# Patient Record
Sex: Female | Born: 1960 | Race: White | Hispanic: No | Marital: Married | Smoking: Former smoker
Health system: Southern US, Community
[De-identification: ages and names within clinical notes are randomized; demographics above are authoritative.]

## PROBLEM LIST (undated history)

## (undated) DIAGNOSIS — F419 Anxiety disorder, unspecified: Secondary | ICD-10-CM

## (undated) DIAGNOSIS — Z8489 Family history of other specified conditions: Secondary | ICD-10-CM

## (undated) DIAGNOSIS — F32A Depression, unspecified: Secondary | ICD-10-CM

## (undated) DIAGNOSIS — T4145XA Adverse effect of unspecified anesthetic, initial encounter: Secondary | ICD-10-CM

## (undated) DIAGNOSIS — Z8669 Personal history of other diseases of the nervous system and sense organs: Secondary | ICD-10-CM

## (undated) DIAGNOSIS — I340 Nonrheumatic mitral (valve) insufficiency: Secondary | ICD-10-CM

## (undated) DIAGNOSIS — T8859XA Other complications of anesthesia, initial encounter: Secondary | ICD-10-CM

## (undated) DIAGNOSIS — H269 Unspecified cataract: Secondary | ICD-10-CM

## (undated) DIAGNOSIS — F329 Major depressive disorder, single episode, unspecified: Secondary | ICD-10-CM

## (undated) DIAGNOSIS — C55 Malignant neoplasm of uterus, part unspecified: Secondary | ICD-10-CM

## (undated) DIAGNOSIS — M069 Rheumatoid arthritis, unspecified: Secondary | ICD-10-CM

## (undated) DIAGNOSIS — I209 Angina pectoris, unspecified: Secondary | ICD-10-CM

## (undated) DIAGNOSIS — M797 Fibromyalgia: Secondary | ICD-10-CM

## (undated) DIAGNOSIS — R011 Cardiac murmur, unspecified: Secondary | ICD-10-CM

## (undated) DIAGNOSIS — I499 Cardiac arrhythmia, unspecified: Secondary | ICD-10-CM

## (undated) DIAGNOSIS — E785 Hyperlipidemia, unspecified: Secondary | ICD-10-CM

## (undated) HISTORY — DX: Fibromyalgia: M79.7

## (undated) HISTORY — PX: APPENDECTOMY: SHX54

## (undated) HISTORY — PX: LAMINECTOMY: SHX219

## (undated) HISTORY — PX: OTHER SURGICAL HISTORY: SHX169

## (undated) HISTORY — PX: CARDIAC CATHETERIZATION: SHX172

## (undated) HISTORY — PX: TONSILLECTOMY: SUR1361

## (undated) HISTORY — PX: EYE SURGERY: SHX253

## (undated) HISTORY — PX: ABDOMINAL HYSTERECTOMY: SHX81

---

## 2013-07-01 ENCOUNTER — Encounter (HOSPITAL_COMMUNITY): Payer: Self-pay | Admitting: Emergency Medicine

## 2013-07-01 ENCOUNTER — Emergency Department (HOSPITAL_COMMUNITY): Payer: BC Managed Care – PPO

## 2013-07-01 ENCOUNTER — Emergency Department (HOSPITAL_COMMUNITY)
Admission: EM | Admit: 2013-07-01 | Discharge: 2013-07-02 | Disposition: A | Discharge status: Home / Self Care | Payer: BC Managed Care – PPO | Attending: Emergency Medicine | Admitting: Emergency Medicine

## 2013-07-01 DIAGNOSIS — Z8542 Personal history of malignant neoplasm of other parts of uterus: Secondary | ICD-10-CM | POA: Insufficient documentation

## 2013-07-01 DIAGNOSIS — Z8679 Personal history of other diseases of the circulatory system: Secondary | ICD-10-CM | POA: Insufficient documentation

## 2013-07-01 DIAGNOSIS — IMO0002 Reserved for concepts with insufficient information to code with codable children: Secondary | ICD-10-CM | POA: Insufficient documentation

## 2013-07-01 DIAGNOSIS — Z79899 Other long term (current) drug therapy: Secondary | ICD-10-CM | POA: Insufficient documentation

## 2013-07-01 DIAGNOSIS — F17200 Nicotine dependence, unspecified, uncomplicated: Secondary | ICD-10-CM | POA: Insufficient documentation

## 2013-07-01 DIAGNOSIS — R079 Chest pain, unspecified: Secondary | ICD-10-CM

## 2013-07-01 DIAGNOSIS — Z7982 Long term (current) use of aspirin: Secondary | ICD-10-CM | POA: Insufficient documentation

## 2013-07-01 DIAGNOSIS — E785 Hyperlipidemia, unspecified: Secondary | ICD-10-CM | POA: Insufficient documentation

## 2013-07-01 DIAGNOSIS — M069 Rheumatoid arthritis, unspecified: Secondary | ICD-10-CM | POA: Insufficient documentation

## 2013-07-01 DIAGNOSIS — R0789 Other chest pain: Secondary | ICD-10-CM | POA: Insufficient documentation

## 2013-07-01 HISTORY — DX: Hyperlipidemia, unspecified: E78.5

## 2013-07-01 HISTORY — DX: Rheumatoid arthritis, unspecified: M06.9

## 2013-07-01 HISTORY — DX: Nonrheumatic mitral (valve) insufficiency: I34.0

## 2013-07-01 HISTORY — DX: Malignant neoplasm of uterus, part unspecified: C55

## 2013-07-01 LAB — BASIC METABOLIC PANEL
BUN: 15 mg/dL (ref 6–23)
CO2: 25 mEq/L (ref 19–32)
Calcium: 10.1 mg/dL (ref 8.4–10.5)
Chloride: 101 mEq/L (ref 96–112)
Creatinine, Ser: 0.93 mg/dL (ref 0.50–1.10)
GFR calc Af Amer: 81 mL/min — ABNORMAL LOW (ref >90)
GFR calc non Af Amer: 70 mL/min — ABNORMAL LOW (ref >90)
Glucose, Bld: 132 mg/dL — ABNORMAL HIGH (ref 70–99)
Potassium: 4.2 mEq/L (ref 3.7–5.3)
Sodium: 140 mEq/L (ref 137–147)

## 2013-07-01 LAB — CBC
HCT: 45.7 % (ref 36.0–46.0)
Hemoglobin: 15.5 g/dL — ABNORMAL HIGH (ref 12.0–15.0)
MCH: 30.6 pg (ref 26.0–34.0)
MCHC: 33.9 g/dL (ref 30.0–36.0)
MCV: 90.1 fL (ref 78.0–100.0)
Platelets: 232 10*3/uL (ref 150–400)
RBC: 5.07 MIL/uL (ref 3.87–5.11)
RDW: 14.8 % (ref 11.5–15.5)
WBC: 11 10*3/uL — ABNORMAL HIGH (ref 4.0–10.5)

## 2013-07-01 LAB — POCT I-STAT TROPONIN I
Troponin i, poc: 0 ng/mL (ref 0.00–0.08)
Troponin i, poc: 0 ng/mL (ref 0.00–0.08)

## 2013-07-01 MED ORDER — NITROGLYCERIN 0.4 MG SL SUBL
0.4000 mg | SUBLINGUAL_TABLET | SUBLINGUAL | Status: DC | PRN
  Filled 2013-07-01: qty 25

## 2013-07-01 NOTE — Discharge Instructions (Signed)
Chest Pain (Nonspecific) °It is often hard to give a specific diagnosis for the cause of chest pain. There is always a chance that your pain could be related to something serious, such as a heart attack or a blood clot in the lungs. You need to follow up with your caregiver for further evaluation. °CAUSES  °· Heartburn. °· Pneumonia or bronchitis. °· Anxiety or stress. °· Inflammation around your heart (pericarditis) or lung (pleuritis or pleurisy). °· A blood clot in the lung. °· A collapsed lung (pneumothorax). It can develop suddenly on its own (spontaneous pneumothorax) or from injury (trauma) to the chest. °· Shingles infection (herpes zoster virus). °The chest wall is composed of bones, muscles, and cartilage. Any of these can be the source of the pain. °· The bones can be bruised by injury. °· The muscles or cartilage can be strained by coughing or overwork. °· The cartilage can be affected by inflammation and become sore (costochondritis). °DIAGNOSIS  °Lab tests or other studies, such as X-rays, electrocardiography, stress testing, or cardiac imaging, may be needed to find the cause of your pain.  °TREATMENT  °· Treatment depends on what may be causing your chest pain. Treatment may include: °· Acid blockers for heartburn. °· Anti-inflammatory medicine. °· Pain medicine for inflammatory conditions. °· Antibiotics if an infection is present. °· You may be advised to change lifestyle habits. This includes stopping smoking and avoiding alcohol, caffeine, and chocolate. °· You may be advised to keep your head raised (elevated) when sleeping. This reduces the chance of acid going backward from your stomach into your esophagus. °· Most of the time, nonspecific chest pain will improve within 2 to 3 days with rest and mild pain medicine. °HOME CARE INSTRUCTIONS  °· If antibiotics were prescribed, take your antibiotics as directed. Finish them even if you start to feel better. °· For the next few days, avoid physical  activities that bring on chest pain. Continue physical activities as directed. °· Do not smoke. °· Avoid drinking alcohol. °· Only take over-the-counter or prescription medicine for pain, discomfort, or fever as directed by your caregiver. °· Follow your caregiver's suggestions for further testing if your chest pain does not go away. °· Keep any follow-up appointments you made. If you do not go to an appointment, you could develop lasting (chronic) problems with pain. If there is any problem keeping an appointment, you must call to reschedule. °SEEK MEDICAL CARE IF:  °· You think you are having problems from the medicine you are taking. Read your medicine instructions carefully. °· Your chest pain does not go away, even after treatment. °· You develop a rash with blisters on your chest. °SEEK IMMEDIATE MEDICAL CARE IF:  °· You have increased chest pain or pain that spreads to your arm, neck, jaw, back, or abdomen. °· You develop shortness of breath, an increasing cough, or you are coughing up blood. °· You have severe back or abdominal pain, feel nauseous, or vomit. °· You develop severe weakness, fainting, or chills. °· You have a fever. °THIS IS AN EMERGENCY. Do not wait to see if the pain will go away. Get medical help at once. Call your local emergency services (911 in [place] Do not drive yourself to the hospital. °MAKE SURE YOU:  °· Understand these instructions. °· Will watch your condition. °· Will get help right away if you are not doing well or get worse. °Document Released: 02/12/2005 Document Revised: 07/28/2011 Document Reviewed: 12/09/2007 °[employer]® Patient Information ©2014 [employer]   [employer]. ° °

## 2013-07-01 NOTE — ED Provider Notes (Signed)
CSN: [id number]     Arrival date & time 07/01/13  1934 History   First MD Initiated Contact with Patient 07/01/13 2009     Chief Complaint  Patient presents with  . Chest Pain     (Consider location/radiation/quality/duration/timing/severity/associated sxs/prior Treatment) HPI  53 year old female with history of mitral valve regurg, hyperlipidemia, and uterine cancer presents complaining of chest pain. Patient reports around 6:30 PM she was laying down when she developed is gradual onset of midsternal chest pressure which radiates to her jaw, and to her back. States the pain lasted for about 40 minutes and resolved. She did take a baby aspirin at that time. Denies any fever, chills, headache, lightheadedness, dizziness, nausea, diaphoresis, shortness of breath, productive cough, hemoptysis, abdominal pain, dysuria, rash, numbness or weakness. States she has pain at this in the past. Usually would have 2-3 episodes per year for more than 5 years, but this episode lasted the longest.  States she has cardiac cath done 2 years ago for the same complaint and was normal. This was done at an outside hospital as patient was living in [place]. She is currently chest pain-free. She denies any recent strenuous exercise. She denies anything that makes the pain better or worse. Patient also mentioned that she has history of rheumatoid arthritis and currently on methotrexate.  She has no significant factors concerning for PE.    Past Medical History  Diagnosis Date  . Hyperlipidemia   . Mitral valve regurgitation   . Rheumatoid arthritis   . Uterine cancer    Past Surgical History  Procedure Laterality Date  . Abdominal hysterectomy     No family history on file. History  Substance Use Topics  . Smoking status: Current Every Day Smoker -- 2.00 packs/day    Types: Cigarettes  . Smokeless tobacco: Never Used  . Alcohol Use: No   OB History   Grav Para Term Preterm Abortions TAB SAB Ect Mult  Living                 Review of Systems  All other systems reviewed and are negative.      Allergies  Bee venom and Erythromycin  Home Medications   Current Outpatient Rx  Name  Route  Sig  Dispense  Refill  . aspirin 81 MG tablet   Oral   Take 81 mg by mouth daily.         Marland Kitchen atorvastatin (LIPITOR) 40 MG tablet   Oral   Take 40 mg by mouth daily.         . clonazePAM (KLONOPIN) 1 MG tablet   Oral   Take 1 mg by mouth 2 (two) times daily.         . folic acid (FOLVITE) 1 MG tablet   Oral   Take 1 mg by mouth daily.         . methotrexate 25 MG/ML injection   Subcutaneous   Inject 15 mg/m2 into the skin once a week. On the same day at bedtime each week on (Sundays)         . predniSONE (DELTASONE) 5 MG tablet   Oral   Take 5 mg by mouth. 4 tabs daily for 7 days, 3 tabs daily for 7 days, 2 tabs daily for 7 days then 1 tab daily         . sertraline (ZOLOFT) 100 MG tablet   Oral   Take 100 mg by mouth at bedtime.         Marland Kitchen  traZODone (DESYREL) 150 MG tablet   Oral   Take 150 mg by mouth at bedtime as needed for sleep.          BP 132/67  Pulse 86  Temp(Src) 98.1 F (36.7 C) (Oral)  Resp 14  Ht 5\' 7"  (1.702 m)  Wt 200 lb (90.719 kg)  BMI 31.32 kg/m2  SpO2 98% Physical Exam  Nursing note and vitals reviewed. Constitutional: She appears well-developed and well-nourished. No distress.  Awake, alert, nontoxic appearance  HENT:  Head: Atraumatic.  Eyes: Conjunctivae are normal. Right eye exhibits no discharge. Left eye exhibits no discharge.  Neck: Neck supple.  Cardiovascular: Normal rate and regular rhythm.   Pulmonary/Chest: Effort normal. No respiratory distress. She exhibits no tenderness (generalized anterior chest wall tenderness to palpation without focal point tenderness, crepitus, emphysema, paradoxical chest movement, or overlying skin changes).  Abdominal: Soft. There is no tenderness. There is no rebound.  Musculoskeletal: She  exhibits no tenderness.  ROM appears intact, no obvious focal weakness  Neurological:  Mental status and motor strength appears intact  Skin: No rash noted.  Psychiatric: She has a normal mood and affect.    ED Course  Procedures (including critical care time)   Date: 07/01/2013  Rate: 92  Rhythm: normal sinus rhythm  QRS Axis: normal  Intervals: normal  ST/T Wave abnormalities: nonspecific ST/T changes  Conduction Disutrbances:multiple PVC  Narrative Interpretation:   Old EKG Reviewed: none available    9:27 PM Patient with episodic midsternal chest pressure. She is currently chest pain-free. She does have some cardiac risk factors including being a smoker, hyperlipidemia.  No lightheadedness or dizziness concerning for aortic dissection.  Chest pain is reproducible, but pt sts she thinks she has fibromyalgias.  She does have remote hx of uterine cancer but of low risk for PE.  Pt is currently well appearing, in NAD.  Work up initiated.    10:00 PM EKG without acute ischemic changes, trop neg, CXR unremarkable, CBC and BMP reassuring.  Care discussed with Dr. Zenia Resides.    11:57 PM Patient has normal delta troponin.  No active CP.  Agrees to f/u with PCP for further care.  Return precaution discussed.    Labs Review Labs Reviewed  CBC - Abnormal; Notable for the following:    WBC 11.0 (*)    Hemoglobin 15.5 (*)    All other components within normal limits  BASIC METABOLIC PANEL - Abnormal; Notable for the following:    Glucose, Bld 132 (*)    GFR calc non Af Amer 70 (*)    GFR calc Af Amer 81 (*)    All other components within normal limits  POCT I-STAT TROPONIN I  POCT I-STAT TROPONIN I   Imaging Review Dg Chest 2 View  07/01/2013   CLINICAL DATA:  Chest pain.  EXAM: CHEST  2 VIEW  COMPARISON:  None.  FINDINGS: The heart size and mediastinal contours are within normal limits. Both lungs are clear. No pleural effusion or pneumothorax is noted. The visualized skeletal  structures are unremarkable.  IMPRESSION: No active cardiopulmonary disease.   Electronically Signed   By: Sabino Dick M.D.   On: 07/01/2013 21:04    EKG Interpretation   None       MDM   Final diagnoses:  Chest pain at rest    BP 132/67  Pulse 86  Temp(Src) 98.1 F (36.7 C) (Oral)  Resp 14  Ht 5\' 7"  (1.702 m)  Wt 200 lb (90.719  kg)  BMI 31.32 kg/m2  SpO2 98%  I have reviewed nursing notes and vital signs. I personally reviewed the imaging tests through PACS system  I reviewed available ER/hospitalization records thought the EMR     Domenic Moras, [place] 07/01/13 2359

## 2013-07-01 NOTE — ED Notes (Signed)
Pt. On cardiac monitor. 

## 2013-07-01 NOTE — ED Provider Notes (Signed)
Medical screening examination/treatment/procedure(s) were conducted as a shared visit with non-physician practitioner(s) and myself.  I personally evaluated the patient during the encounter.  EKG Interpretation   None      patient here with right-sided chest pain began when she was lying down. History of multiple episodes of similar symptoms and according to the patient she had a heart catheterization that had clean coronaries 2 years ago. She denied any associated nausea, vomiting, diaphoresis or shortness of breath. Her EKG does not show any signs of ischemia. We will do a delta troponin and discharge if her enzymes are negative   Leota Jacobsen, MD 07/01/13 2314

## 2013-07-01 NOTE — ED Notes (Signed)
Pt reports rt-sided chest pressure that began while pt was lying down, pain has improved at this time. Pt denies any shortness of breath, n/v, or diaphoresis. Pt in no acute distress, skin warm and dry. Pt w/ hx of similar chest pain and cardiac cath x79yrs ago that was clear at that time.

## 2013-07-01 NOTE — Progress Notes (Signed)
   CARE MANAGEMENT ED NOTE 07/01/2013  Patient:  Heather Douglas, Heather Douglas   Account Number:  [id number]  Date Initiated:  07/01/2013  Documentation initiated by:  Livia Snellen  Subjective/Objective Assessment:   Patient presents to Ed with right sided chest pressure     Subjective/Objective Assessment Detail:   Patient with pmhx of mvr, uterine CA, rheumatoid arthritis and hyperlipidemia     Action/Plan:   Action/Plan Detail:   Anticipated DC Date:       Status Recommendation to Physician:   Result of Recommendation:    Other ED [place]  Other  PCP issues    Choice offered to / List presented to:            Status of service:  Completed, signed off  ED Comments:   ED Comments Detail:  Patient confirms her pcp is Dr. Deland Pretty.  System updated.

## 2013-07-01 NOTE — ED Notes (Signed)
Pt not given nitro glycerin due to a lack of chest pain

## 2013-07-04 NOTE — ED Provider Notes (Signed)
Medical screening examination/treatment/procedure(s) were performed by non-physician practitioner and as supervising physician I was immediately available for consultation/collaboration.  EKG Interpretation    Date/Time:  Friday July 01 2013 19:40:03 EST Ventricular Rate:  92 PR Interval:  137 QRS Duration: 84 QT Interval:  361 QTC Calculation: 447 R Axis:   23 Text Interpretation:  Sinus rhythm Multiple ventricular premature complexes Probable anteroseptal infarct, old Confirmed by Zenia Resides  MD, Diasha Castleman ([id number]) on 07/01/2013 11:13:43 PM             Leota Jacobsen, MD 07/04/13 2329

## 2013-09-05 ENCOUNTER — Ambulatory Visit: Payer: BC Managed Care – PPO | Admitting: Cardiovascular Disease

## 2013-10-18 ENCOUNTER — Ambulatory Visit: Payer: BC Managed Care – PPO | Admitting: Cardiovascular Disease

## 2013-10-27 ENCOUNTER — Telehealth: Payer: Self-pay | Admitting: Cardiovascular Disease

## 2013-10-27 NOTE — Telephone Encounter (Signed)
Office notified via fax that patient had cancelled their appointment with Dr. Gwenlyn Found.

## 2013-11-29 ENCOUNTER — Other Ambulatory Visit: Payer: Self-pay | Admitting: Specialist

## 2013-11-29 DIAGNOSIS — M5126 Other intervertebral disc displacement, lumbar region: Secondary | ICD-10-CM

## 2013-12-05 ENCOUNTER — Other Ambulatory Visit: Payer: BC Managed Care – PPO

## 2013-12-12 ENCOUNTER — Ambulatory Visit
Admission: RE | Admit: 2013-12-12 | Discharge: 2013-12-12 | Disposition: A | Discharge status: Home / Self Care | Payer: BC Managed Care – PPO | Source: Ambulatory Visit | Attending: Specialist | Admitting: Specialist

## 2013-12-12 ENCOUNTER — Other Ambulatory Visit: Payer: Self-pay | Admitting: Specialist

## 2013-12-12 ENCOUNTER — Ambulatory Visit
Admission: RE | Admit: 2013-12-12 | Discharge: 2013-12-12 | Disposition: A | Discharge status: Home / Self Care | Payer: Self-pay | Source: Ambulatory Visit | Attending: Specialist | Admitting: Specialist

## 2013-12-12 VITALS — BP 130/69 | HR 75

## 2013-12-12 DIAGNOSIS — M5126 Other intervertebral disc displacement, lumbar region: Secondary | ICD-10-CM

## 2013-12-12 MED ORDER — DIAZEPAM 5 MG PO TABS: 10.0000 mg | ORAL_TABLET | Freq: Once | ORAL | Status: DC

## 2013-12-12 MED ORDER — KETOROLAC TROMETHAMINE 60 MG/2ML IM SOLN
60.0000 mg | Freq: Once | INTRAMUSCULAR | Status: AC
  Administered 2013-12-12: 60 mg via INTRAMUSCULAR

## 2013-12-12 MED ORDER — IOHEXOL 180 MG/ML  SOLN
15.0000 mL | Freq: Once | INTRAMUSCULAR | Status: AC | PRN
  Administered 2013-12-12: 15 mL via INTRATHECAL

## 2013-12-12 NOTE — Progress Notes (Signed)
Patient states she has been off Trazodone and Zoloft for the past two days.  jkl

## 2013-12-12 NOTE — Discharge Instructions (Signed)
Myelogram Discharge Instructions  1. Go home and rest quietly for the next 24 hours.  It is important to lie flat for the next 24 hours.  Get up only to go to the restroom.  You may lie in the bed or on a couch on your back, your stomach, your left side or your right side.  You may have one pillow under your head.  You may have pillows between your knees while you are on your side or under your knees while you are on your back.  2. DO NOT drive today.  Recline the seat as far back as it will go, while still wearing your seat belt, on the way home.  3. You may get up to go to the bathroom as needed.  You may sit up for 10 minutes to eat.  You may resume your normal diet and medications unless otherwise indicated.  Drink plenty of extra fluids today and tomorrow.  4. The incidence of a spinal headache with nausea and/or vomiting is about 5% (one in 20 patients).  If you develop a headache, lie flat and drink plenty of fluids until the headache goes away.  Caffeinated beverages may be helpful.  If you develop severe nausea and vomiting or a headache that does not go away with flat bed rest, call [phone].  5. You may resume normal activities after your 24 hours of bed rest is over; however, do not exert yourself strongly or do any heavy lifting tomorrow.  6. Call your physician for a follow-up appointment.   You may resume Sertraline and Trazodone on Tuesday, December 13, 2013 after 11:00a.m.

## 2014-04-17 ENCOUNTER — Ambulatory Visit: Payer: Self-pay | Admitting: Orthopedic Surgery

## 2014-04-28 ENCOUNTER — Ambulatory Visit: Payer: Self-pay | Admitting: Orthopedic Surgery

## 2014-04-28 NOTE — H&P (Signed)
Heather Douglas is an 53 y.o. female.   Chief Complaint: back and leg pain HPI: The patient is a 53 year old female who presents today for follow up of their back. The patient is being followed for their low back symptoms. They are now year(s) out from when symptoms began. Symptoms reported today include: pain. The patient states that they are doing poorly. Current treatment includes: relative rest, NSAIDs and heat. The following medication has been used for pain control: ibuprofen. The patient presents today following lumbar CT/Myelogram.  Heather Douglas follows up with a CT myelogram. The myelogram indicates, when standing and leaning to the right, there is some lateral recess stenosis that may affect the L5 nerve root. She's having really no left-sided symptoms, mainly right-sided symptoms. She does have some arthritic changes in her SI joint, left greater than right. She's on Methotrexate. According to her and her husband, she does not have a diagnosis of rheumatoid arthritis, mainly osteoarthritis, and it is in her knees as well. She's had cortisone injections there. She did get an epidural at 5-1 on the right. She had 90% of relief of pain for awhile, then it returned.  Past Medical History  Diagnosis Date  . Hyperlipidemia   . Mitral valve regurgitation   . Rheumatoid arthritis   . Uterine cancer     Past Surgical History  Procedure Laterality Date  . Abdominal hysterectomy      No family history on file. Social History:  reports that she has been smoking Cigarettes.  She has been smoking about 2.00 packs per day. She has never used smokeless tobacco. She reports that she does not drink alcohol or use illicit drugs.  Allergies:  Allergies  Allergen Reactions  . Dilaudid [Hydromorphone Hcl] Itching  . Erythromycin Other (See Comments)    "Severe GI distress"  . Bee Venom   . Valium [Diazepam] Other (See Comments)    "makes me nutty"     (Not in a hospital admission)  No  results found for this or any previous visit (from the past 48 hour(s)). No results found.  Review of Systems  Constitutional: Negative.   HENT: Negative.   Eyes: Negative.   Respiratory: Negative.   Cardiovascular: Negative.   Gastrointestinal: Negative.   Genitourinary: Negative.   Musculoskeletal: Positive for back pain.  Skin: Negative.   Neurological: Positive for focal weakness.  Psychiatric/Behavioral: Negative.     There were no vitals taken for this visit. Physical Exam  Constitutional: She is oriented to person, place, and time. She appears well-developed and well-nourished.  HENT:  Head: Normocephalic and atraumatic.  Eyes: Conjunctivae and EOM are normal. Pupils are equal, round, and reactive to light.  Neck: Normal range of motion. Neck supple.  Cardiovascular: Normal rate and regular rhythm.   Respiratory: Effort normal and breath sounds normal.  GI: Soft. Bowel sounds are normal.  Musculoskeletal:  On exam, her SLR produces buttock pain, moderate thigh pain. EHL is 4+/5 on the right compared to the left. Pain with extension and flexion of the lumbar spine. Remainder of LS spine is normal outside the knee exam.  Lumbar spine exam reveals no evidence of soft tissue swelling, ecchymosis or deformity. The abdomen is soft and nontender. Nontender over the trochanters. No cellulitis or lymphadenopathy.  Motor is 5/5 including tibialis anterior, plantar flexion, quadriceps and hamstrings. Patient is normoreflexic. There is no Babinski or clonus. Sensory exam is intact to light touch. The patient has good distal pulses. No DVT. No   pain and normal range of motion without instability of the hips, knees and ankles.  Neurological: She is alert and oriented to person, place, and time. She has normal reflexes.  Skin: Skin is warm and dry.  Psychiatric: She has a normal mood and affect.    MRI demonstrates mild lateral recess stenosis at 4-5 on the right, but no definitive  neurocompression.   The myelogram indicates, when standing and leaning to the right, there is some lateral recess stenosis that may affect the L5 nerve root. She's having really no left-sided symptoms, mainly right-sided symptoms. She does have some arthritic changes in her SI joint, left greater than right.  Assessment/Plan 1. 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 She had 90% temporary relief from an epidural at 4-5 to the right. 2. Underlying mild SI joint arthrosis. 3. Osteoarthritis of her knees.  I had an extensive discussion concerning her current pathology, relevant anatomy and treatment options in front of her husband, reviewing her anatomy, using models and reviewing the MRI, explaining the differential diagnosis and confirmation with the CT myelogram and the epidural results. Given the temporary relief, I do feel that at least some of her pain is dynamic. No compression of the 5 root. Certainly, a laminectomy at 4-5 and decompression of the lateral recess is an option. The other is to live with her symptoms, additional injections, etc.  Risks and benefits of this procedure were discussed with the patient including worsening of symptoms, no changes in symptoms, recurrent disc herniation, scar tissue, epidural fibrosis, damage to neurovascular structures, cerebral spinal fluid leak which would require repair or patching, DVT, PE, anesthetic complications, etc. We discussed the perioperative course, the hospitalization, and the need for postoperative rehabilitation and the time estimate for recovery. We also discussed the possibility of future surgery including repeat decompression, fusion. The patient was provided an illustrated handout which was discussed in detail. Appropriate anatomic models were used as well.  I may also try SI joint injections in the future, but without  significant relief from an epidural, I do feel this is a pain generator, or at least one of them. She has multiple other issues, and we discussed overlapping pain patterns; however, this is certainly an option. She would like to proceed with that. I discussed it with her and her husband. Provided her with an illustrated handout and discussed it in detail. She has Methotrexate. I asked her to call Dr. Beekman as to whether we can discontinue that perioperatively. We will set her up accordingly. She, in the interim, is to avoid extension and flexion, avoid unsupported bending. She also takes 7 mg. of Prednisone which she can continue. With any questions, they can call.  Plan microlumbar decompression L4-5 right  BISSELL, JACLYN M. PA-C for Dr. Beane 04/28/2014, 9:09 AM    

## 2014-05-09 ENCOUNTER — Ambulatory Visit (HOSPITAL_COMMUNITY)
Admission: RE | Admit: 2014-05-09 | Discharge: 2014-05-09 | Disposition: A | Discharge status: Home / Self Care | Payer: BC Managed Care – PPO | Source: Ambulatory Visit | Attending: Orthopedic Surgery | Admitting: Orthopedic Surgery

## 2014-05-09 ENCOUNTER — Encounter (HOSPITAL_COMMUNITY): Payer: Self-pay

## 2014-05-09 ENCOUNTER — Encounter (HOSPITAL_COMMUNITY)
Admission: RE | Admit: 2014-05-09 | Discharge: 2014-05-09 | Disposition: A | Discharge status: Home / Self Care | Payer: BC Managed Care – PPO | Source: Ambulatory Visit | Attending: Specialist | Admitting: Specialist

## 2014-05-09 DIAGNOSIS — M48061 Spinal stenosis, lumbar region without neurogenic claudication: Secondary | ICD-10-CM

## 2014-05-09 DIAGNOSIS — M4806 Spinal stenosis, lumbar region: Secondary | ICD-10-CM | POA: Diagnosis not present

## 2014-05-09 HISTORY — DX: Family history of other specified conditions: Z84.89

## 2014-05-09 HISTORY — DX: Angina pectoris, unspecified: I20.9

## 2014-05-09 HISTORY — DX: Major depressive disorder, single episode, unspecified: F32.9

## 2014-05-09 HISTORY — DX: Depression, unspecified: F32.A

## 2014-05-09 HISTORY — DX: Cardiac arrhythmia, unspecified: I49.9

## 2014-05-09 HISTORY — DX: Cardiac murmur, unspecified: R01.1

## 2014-05-09 HISTORY — DX: Anxiety disorder, unspecified: F41.9

## 2014-05-09 HISTORY — DX: Personal history of other diseases of the nervous system and sense organs: Z86.69

## 2014-05-09 HISTORY — DX: Adverse effect of unspecified anesthetic, initial encounter: T41.45XA

## 2014-05-09 HISTORY — DX: Unspecified cataract: H26.9

## 2014-05-09 HISTORY — DX: Other complications of anesthesia, initial encounter: T88.59XA

## 2014-05-09 LAB — BASIC METABOLIC PANEL
Anion gap: 8 (ref 5–15)
BUN: 15 mg/dL (ref 6–23)
CO2: 27 mmol/L (ref 19–32)
Calcium: 10.1 mg/dL (ref 8.4–10.5)
Chloride: 103 mEq/L (ref 96–112)
Creatinine, Ser: 0.73 mg/dL (ref 0.50–1.10)
GFR calc Af Amer: >90 mL/min (ref >90)
GFR calc non Af Amer: >90 mL/min (ref >90)
Glucose, Bld: 108 mg/dL — ABNORMAL HIGH (ref 70–99)
Potassium: 3.9 mmol/L (ref 3.5–5.1)
Sodium: 138 mmol/L (ref 135–145)

## 2014-05-09 LAB — CBC
HCT: 41.7 % (ref 36.0–46.0)
Hemoglobin: 13.3 g/dL (ref 12.0–15.0)
MCH: 28.7 pg (ref 26.0–34.0)
MCHC: 31.9 g/dL (ref 30.0–36.0)
MCV: 89.9 fL (ref 78.0–100.0)
Platelets: 267 10*3/uL (ref 150–400)
RBC: 4.64 MIL/uL (ref 3.87–5.11)
RDW: 13.7 % (ref 11.5–15.5)
WBC: 10.7 10*3/uL — ABNORMAL HIGH (ref 4.0–10.5)

## 2014-05-09 LAB — SURGICAL PCR SCREEN
MRSA, PCR: NEGATIVE
Staphylococcus aureus: POSITIVE — AB

## 2014-05-09 NOTE — Patient Instructions (Addendum)
Heather Douglas  05/09/2014   Your procedure is scheduled on: Thursday May 18, 2014  Report to [facility]  Entrance and follow signs to               [facility] arrive at 8:30 AM.  Call this number if you have problems the morning of surgery [phone]   Remember:  Do not eat food or drink liquids :After Midnight.     Take these medicines the morning of surgery with A SIP OF WATER: Clonazepam if needed                               You may not have any metal on your body including hair pins and              piercings  Do not wear jewelry, make-up, lotions, powders or perfumes.             Do not wear nail polish.  Do not shave  48 hours prior to surgery.            .   Do not bring valuables to the hospital. [name].  Contacts, dentures or bridgework may not be worn into surgery.  Leave suitcase in the car. After surgery it may be brought to your room.                  Please read over the following fact sheets you were given:Incentive Spirometer _____________________________________________________________________             [facility] - Preparing for Surgery Before surgery, you can play an important role.  Because skin is not sterile, your skin needs to be as free of germs as possible.  You can reduce the number of germs on your skin by washing with CHG (chlorahexidine gluconate) soap before surgery.  CHG is an antiseptic cleaner which kills germs and bonds with the skin to continue killing germs even after washing. Please DO NOT use if you have an allergy to CHG or antibacterial soaps.  If your skin becomes reddened/irritated stop using the CHG and inform your nurse when you arrive at Short Stay. Do not shave (including legs and underarms) for at least 48 hours prior to the first CHG shower.  You may shave your face/neck. Please follow these instructions carefully:  1.  Shower with CHG  Soap the night before surgery and the  morning of Surgery.  2.  If you choose to wash your hair, wash your hair first as usual with your  normal  shampoo.  3.  After you shampoo, rinse your hair and body thoroughly to remove the  shampoo.                           4.  Use CHG as you would any other liquid soap.  You can apply chg directly  to the skin and wash                       Gently with a scrungie or clean washcloth.  5.  Apply the CHG Soap to your body ONLY FROM THE NECK DOWN.   Do not use on face/  open                           Wound or open sores. Avoid contact with eyes, ears mouth and genitals (private parts).                       Wash face,  Genitals (private parts) with your normal soap.             6.  Wash thoroughly, paying special attention to the area where your surgery  will be performed.  7.  Thoroughly rinse your body with warm water from the neck down.  8.  DO NOT shower/wash with your normal soap after using and rinsing off  the CHG Soap.                9.  Pat yourself dry with a clean towel.            10.  Wear clean pajamas.            11.  Place clean sheets on your bed the night of your first shower and do not  sleep with pets. Day of Surgery : Do not apply any lotions/deodorants the morning of surgery.  Please wear clean clothes to the hospital/surgery center.  FAILURE TO FOLLOW THESE INSTRUCTIONS MAY RESULT IN THE CANCELLATION OF YOUR SURGERY PATIENT SIGNATURE_________________________________  NURSE SIGNATURE__________________________________  ________________________________________________________________________   Heather Douglas  An incentive spirometer is a tool that can help keep your lungs clear and active. This tool measures how well you are filling your lungs with each breath. Taking long deep breaths may help reverse or decrease the chance of developing breathing (pulmonary) problems (especially infection) following:  A long period of time  when you are unable to move or be active. BEFORE THE PROCEDURE   If the spirometer includes an indicator to show your best effort, your nurse or respiratory therapist will set it to a desired goal.  If possible, sit up straight or lean slightly forward. Try not to slouch.  Hold the incentive spirometer in an upright position. INSTRUCTIONS FOR USE  1. Sit on the edge of your bed if possible, or sit up as far as you can in bed or on a chair. 2. Hold the incentive spirometer in an upright position. 3. Breathe out normally. 4. Place the mouthpiece in your mouth and seal your lips tightly around it. 5. Breathe in slowly and as deeply as possible, raising the piston or the ball toward the top of the column. 6. Hold your breath for 3-5 seconds or for as long as possible. Allow the piston or ball to fall to the bottom of the column. 7. Remove the mouthpiece from your mouth and breathe out normally. 8. Rest for a few seconds and repeat Steps 1 through 7 at least 10 times every 1-2 hours when you are awake. Take your time and take a few normal breaths between deep breaths. 9. The spirometer may include an indicator to show your best effort. Use the indicator as a goal to work toward during each repetition. 10. After each set of 10 deep breaths, practice coughing to be sure your lungs are clear. If you have an incision (the cut made at the time of surgery), support your incision when coughing by placing a pillow or rolled up towels firmly against it. Once you are able to get out of bed, walk around indoors and  cough well. You may stop using the incentive spirometer when instructed by your caregiver.  RISKS AND COMPLICATIONS  Take your time so you do not get dizzy or light-headed.  If you are in pain, you may need to take or ask for pain medication before doing incentive spirometry. It is harder to take a deep breath if you are having pain. AFTER USE  Rest and breathe slowly and easily.  It can be  helpful to keep track of a log of your progress. Your caregiver can provide you with a simple table to help with this. If you are using the spirometer at home, follow these instructions: [place] IF:   You are having difficultly using the spirometer.  You have trouble using the spirometer as often as instructed.  Your pain medication is not giving enough relief while using the spirometer.  You develop fever of 100.5 F (38.1 C) or higher. SEEK IMMEDIATE MEDICAL CARE IF:   You cough up bloody sputum that had not been present before.  You develop fever of 102 F (38.9 C) or greater.  You develop worsening pain at or near the incision site. MAKE SURE YOU:   Understand these instructions.  Will watch your condition.  Will get help right away if you are not doing well or get worse. Document Released: 09/15/2006 Document Revised: 07/28/2011 Document Reviewed: 11/16/2006 [facility] Patient Information 2014 [place], [place].   ________________________________________________________________________

## 2014-05-09 NOTE — Progress Notes (Addendum)
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

## 2014-05-18 ENCOUNTER — Encounter (HOSPITAL_COMMUNITY)
Admission: RE | Disposition: A | Discharge status: Home / Self Care | Payer: Self-pay | Source: Ambulatory Visit | Attending: Specialist

## 2014-05-18 ENCOUNTER — Ambulatory Visit (HOSPITAL_COMMUNITY): Payer: BC Managed Care – PPO

## 2014-05-18 ENCOUNTER — Ambulatory Visit (HOSPITAL_COMMUNITY)
Admission: RE | Admit: 2014-05-18 | Discharge: 2014-05-19 | Disposition: A | Discharge status: Home / Self Care | Payer: BC Managed Care – PPO | Source: Ambulatory Visit | Attending: Specialist | Admitting: Specialist

## 2014-05-18 ENCOUNTER — Ambulatory Visit (HOSPITAL_COMMUNITY): Payer: BC Managed Care – PPO | Admitting: Registered Nurse

## 2014-05-18 ENCOUNTER — Encounter (HOSPITAL_COMMUNITY): Payer: Self-pay | Admitting: *Deleted

## 2014-05-18 DIAGNOSIS — Z885 Allergy status to narcotic agent status: Secondary | ICD-10-CM | POA: Diagnosis not present

## 2014-05-18 DIAGNOSIS — I340 Nonrheumatic mitral (valve) insufficiency: Secondary | ICD-10-CM | POA: Diagnosis not present

## 2014-05-18 DIAGNOSIS — Z91030 Bee allergy status: Secondary | ICD-10-CM | POA: Insufficient documentation

## 2014-05-18 DIAGNOSIS — Z419 Encounter for procedure for purposes other than remedying health state, unspecified: Secondary | ICD-10-CM

## 2014-05-18 DIAGNOSIS — E785 Hyperlipidemia, unspecified: Secondary | ICD-10-CM | POA: Insufficient documentation

## 2014-05-18 DIAGNOSIS — F17210 Nicotine dependence, cigarettes, uncomplicated: Secondary | ICD-10-CM | POA: Diagnosis not present

## 2014-05-18 DIAGNOSIS — M4806 Spinal stenosis, lumbar region: Secondary | ICD-10-CM | POA: Insufficient documentation

## 2014-05-18 DIAGNOSIS — Z8542 Personal history of malignant neoplasm of other parts of uterus: Secondary | ICD-10-CM | POA: Diagnosis not present

## 2014-05-18 DIAGNOSIS — M179 Osteoarthritis of knee, unspecified: Secondary | ICD-10-CM | POA: Diagnosis not present

## 2014-05-18 DIAGNOSIS — M48061 Spinal stenosis, lumbar region without neurogenic claudication: Secondary | ICD-10-CM | POA: Diagnosis present

## 2014-05-18 HISTORY — PX: LUMBAR LAMINECTOMY/DECOMPRESSION MICRODISCECTOMY: SHX5026

## 2014-05-18 SURGERY — LUMBAR LAMINECTOMY/DECOMPRESSION MICRODISCECTOMY 1 LEVEL
Anesthesia: General | Site: Back | Laterality: Right

## 2014-05-18 MED ORDER — DEXAMETHASONE SODIUM PHOSPHATE 10 MG/ML IJ SOLN
INTRAMUSCULAR | Status: DC | PRN
  Administered 2014-05-18: 10 mg via INTRAVENOUS

## 2014-05-18 MED ORDER — TRAZODONE HCL 150 MG PO TABS
150.0000 mg | ORAL_TABLET | Freq: Every evening | ORAL | Status: DC | PRN
  Filled 2014-05-18: qty 1

## 2014-05-18 MED ORDER — ONDANSETRON HCL 4 MG/2ML IJ SOLN
INTRAMUSCULAR | Status: DC | PRN
  Administered 2014-05-18: 4 mg via INTRAVENOUS

## 2014-05-18 MED ORDER — GLYCOPYRROLATE 0.2 MG/ML IJ SOLN
INTRAMUSCULAR | Status: DC | PRN
  Administered 2014-05-18: .6 mg via INTRAVENOUS

## 2014-05-18 MED ORDER — SODIUM CHLORIDE 0.9 % IJ SOLN
3.0000 mL | Freq: Two times a day (BID) | INTRAMUSCULAR | Status: DC
  Administered 2014-05-19: 3 mL via INTRAVENOUS

## 2014-05-18 MED ORDER — BISACODYL 5 MG PO TBEC: 5.0000 mg | DELAYED_RELEASE_TABLET | Freq: Every day | ORAL | Status: DC | PRN

## 2014-05-18 MED ORDER — FENTANYL CITRATE 0.05 MG/ML IJ SOLN
INTRAMUSCULAR | Status: AC
Start: 2014-05-18 — End: 2014-05-18
  Filled 2014-05-18: qty 2

## 2014-05-18 MED ORDER — MENTHOL 3 MG MT LOZG: 1.0000 | LOZENGE | OROMUCOSAL | Status: DC | PRN

## 2014-05-18 MED ORDER — SODIUM CHLORIDE 0.9 % IR SOLN
Status: AC
  Filled 2014-05-18: qty 1

## 2014-05-18 MED ORDER — FENTANYL CITRATE 0.05 MG/ML IJ SOLN
INTRAMUSCULAR | Status: DC | PRN
  Administered 2014-05-18: 25 ug via INTRAVENOUS
  Administered 2014-05-18 (×2): 50 ug via INTRAVENOUS
  Administered 2014-05-18: 25 ug via INTRAVENOUS

## 2014-05-18 MED ORDER — METHOCARBAMOL 1000 MG/10ML IJ SOLN
500.0000 mg | Freq: Four times a day (QID) | INTRAVENOUS | Status: DC | PRN
  Filled 2014-05-18: qty 5

## 2014-05-18 MED ORDER — ALUM & MAG HYDROXIDE-SIMETH 200-200-20 MG/5ML PO SUSP: 30.0000 mL | Freq: Four times a day (QID) | ORAL | Status: DC | PRN

## 2014-05-18 MED ORDER — ROCURONIUM BROMIDE 100 MG/10ML IV SOLN
INTRAVENOUS | Status: DC | PRN
Start: 2014-05-18 — End: 2014-05-18
  Administered 2014-05-18: 50 mg via INTRAVENOUS

## 2014-05-18 MED ORDER — PHENOL 1.4 % MT LIQD: 1.0000 | OROMUCOSAL | Status: DC | PRN

## 2014-05-18 MED ORDER — LACTATED RINGERS IV SOLN
INTRAVENOUS | Status: DC
  Administered 2014-05-18: 1000 mL via INTRAVENOUS

## 2014-05-18 MED ORDER — FENTANYL CITRATE 0.05 MG/ML IJ SOLN
INTRAMUSCULAR | Status: AC
  Filled 2014-05-18: qty 5

## 2014-05-18 MED ORDER — FOLIC ACID 1 MG PO TABS
1.0000 mg | ORAL_TABLET | Freq: Every day | ORAL | Status: DC
  Filled 2014-05-18 (×2): qty 1

## 2014-05-18 MED ORDER — CEFAZOLIN SODIUM-DEXTROSE 2-3 GM-% IV SOLR
2.0000 g | INTRAVENOUS | Status: AC
  Administered 2014-05-18: 2 g via INTRAVENOUS

## 2014-05-18 MED ORDER — PROMETHAZINE HCL 25 MG/ML IJ SOLN: 12.5000 mg | Freq: Four times a day (QID) | INTRAMUSCULAR | Status: DC | PRN

## 2014-05-18 MED ORDER — MORPHINE SULFATE 10 MG/ML IJ SOLN: 1.0000 mg | INTRAMUSCULAR | Status: DC | PRN

## 2014-05-18 MED ORDER — BUPIVACAINE-EPINEPHRINE (PF) 0.5% -1:200000 IJ SOLN
INTRAMUSCULAR | Status: AC
  Filled 2014-05-18: qty 30

## 2014-05-18 MED ORDER — METHOCARBAMOL 500 MG PO TABS: 500.0000 mg | ORAL_TABLET | Freq: Four times a day (QID) | ORAL | Status: DC | PRN

## 2014-05-18 MED ORDER — CEFAZOLIN SODIUM-DEXTROSE 2-3 GM-% IV SOLR
INTRAVENOUS | Status: AC
  Filled 2014-05-18: qty 50

## 2014-05-18 MED ORDER — NEOSTIGMINE METHYLSULFATE 10 MG/10ML IV SOLN
INTRAVENOUS | Status: DC | PRN
  Administered 2014-05-18: 4 mg via INTRAVENOUS

## 2014-05-18 MED ORDER — ONDANSETRON HCL 4 MG/2ML IJ SOLN
INTRAMUSCULAR | Status: AC
  Filled 2014-05-18: qty 2

## 2014-05-18 MED ORDER — DOCUSATE SODIUM 100 MG PO CAPS
100.0000 mg | ORAL_CAPSULE | Freq: Two times a day (BID) | ORAL | Status: DC
  Administered 2014-05-18: 100 mg via ORAL

## 2014-05-18 MED ORDER — METHOCARBAMOL 500 MG PO TABS: 500.0000 mg | ORAL_TABLET | Freq: Three times a day (TID) | ORAL | Status: DC | PRN

## 2014-05-18 MED ORDER — ASPIRIN 81 MG PO TABS: 81.0000 mg | ORAL_TABLET | Freq: Every day | ORAL | Status: DC

## 2014-05-18 MED ORDER — CLINDAMYCIN PHOSPHATE 900 MG/50ML IV SOLN
900.0000 mg | Freq: Once | INTRAVENOUS | Status: AC
  Administered 2014-05-18: 900 mg via INTRAVENOUS

## 2014-05-18 MED ORDER — ACETAMINOPHEN 325 MG PO TABS: 650.0000 mg | ORAL_TABLET | ORAL | Status: DC | PRN

## 2014-05-18 MED ORDER — CEFAZOLIN SODIUM-DEXTROSE 2-3 GM-% IV SOLR
2.0000 g | Freq: Three times a day (TID) | INTRAVENOUS | Status: AC
  Administered 2014-05-18 – 2014-05-19 (×3): 2 g via INTRAVENOUS
  Filled 2014-05-18 (×3): qty 50

## 2014-05-18 MED ORDER — OXYCODONE-ACETAMINOPHEN 5-325 MG PO TABS: 1.0000 | ORAL_TABLET | ORAL | Status: DC | PRN

## 2014-05-18 MED ORDER — KCL IN DEXTROSE-NACL 20-5-0.45 MEQ/L-%-% IV SOLN
INTRAVENOUS | Status: DC
  Administered 2014-05-18: 17:00:00 via INTRAVENOUS
  Filled 2014-05-18 (×2): qty 1000

## 2014-05-18 MED ORDER — PROPOFOL 10 MG/ML IV BOLUS
INTRAVENOUS | Status: DC | PRN
  Administered 2014-05-18: 200 mg via INTRAVENOUS

## 2014-05-18 MED ORDER — ACETAMINOPHEN 650 MG RE SUPP: 650.0000 mg | RECTAL | Status: DC | PRN

## 2014-05-18 MED ORDER — FENTANYL CITRATE 0.05 MG/ML IJ SOLN
25.0000 ug | INTRAMUSCULAR | Status: DC | PRN
  Administered 2014-05-18: 25 ug via INTRAVENOUS
  Administered 2014-05-18: 50 ug via INTRAVENOUS

## 2014-05-18 MED ORDER — HYDROCODONE-ACETAMINOPHEN 5-325 MG PO TABS
1.0000 | ORAL_TABLET | ORAL | Status: DC | PRN
  Administered 2014-05-18 – 2014-05-19 (×4): 1 via ORAL
  Filled 2014-05-18 (×4): qty 1

## 2014-05-18 MED ORDER — SENNOSIDES-DOCUSATE SODIUM 8.6-50 MG PO TABS: 1.0000 | ORAL_TABLET | Freq: Every evening | ORAL | Status: DC | PRN

## 2014-05-18 MED ORDER — CLINDAMYCIN PHOSPHATE 900 MG/50ML IV SOLN
INTRAVENOUS | Status: AC
  Filled 2014-05-18: qty 50

## 2014-05-18 MED ORDER — DIPHENHYDRAMINE HCL 12.5 MG/5ML PO ELIX: 12.5000 mg | ORAL_SOLUTION | Freq: Four times a day (QID) | ORAL | Status: DC | PRN

## 2014-05-18 MED ORDER — LIDOCAINE HCL (CARDIAC) 20 MG/ML IV SOLN
INTRAVENOUS | Status: DC | PRN
  Administered 2014-05-18: 100 mg via INTRAVENOUS

## 2014-05-18 MED ORDER — PREDNISONE 5 MG PO TABS
5.0000 mg | ORAL_TABLET | Freq: Every day | ORAL | Status: DC
  Administered 2014-05-19: 5 mg via ORAL
  Filled 2014-05-18 (×2): qty 1

## 2014-05-18 MED ORDER — SERTRALINE HCL 100 MG PO TABS
100.0000 mg | ORAL_TABLET | Freq: Every day | ORAL | Status: DC
  Administered 2014-05-18: 100 mg via ORAL
  Filled 2014-05-18 (×2): qty 1

## 2014-05-18 MED ORDER — LIDOCAINE HCL (CARDIAC) 20 MG/ML IV SOLN
INTRAVENOUS | Status: AC
  Filled 2014-05-18: qty 5

## 2014-05-18 MED ORDER — SODIUM CHLORIDE 0.9 % IJ SOLN: 3.0000 mL | INTRAMUSCULAR | Status: DC | PRN

## 2014-05-18 MED ORDER — DEXAMETHASONE SODIUM PHOSPHATE 10 MG/ML IJ SOLN
INTRAMUSCULAR | Status: AC
  Filled 2014-05-18: qty 1

## 2014-05-18 MED ORDER — DOCUSATE SODIUM 100 MG PO CAPS: 100.0000 mg | ORAL_CAPSULE | Freq: Two times a day (BID) | ORAL | Status: DC | PRN

## 2014-05-18 MED ORDER — PROPOFOL 10 MG/ML IV BOLUS
INTRAVENOUS | Status: AC
  Filled 2014-05-18: qty 20

## 2014-05-18 MED ORDER — BUPIVACAINE-EPINEPHRINE 0.5% -1:200000 IJ SOLN
INTRAMUSCULAR | Status: DC | PRN
  Administered 2014-05-18: 14 mL

## 2014-05-18 MED ORDER — SODIUM CHLORIDE 0.9 % IV SOLN: 250.0000 mL | INTRAVENOUS | Status: DC

## 2014-05-18 MED ORDER — GELATIN ABSORBABLE MT POWD
OROMUCOSAL | Status: DC | PRN
  Administered 2014-05-18: 11:00:00 via TOPICAL

## 2014-05-18 MED ORDER — THROMBIN 5000 UNITS EX SOLR
CUTANEOUS | Status: AC
  Filled 2014-05-18: qty 10000

## 2014-05-18 MED ORDER — ONDANSETRON HCL 4 MG/2ML IJ SOLN: 4.0000 mg | INTRAMUSCULAR | Status: DC | PRN

## 2014-05-18 MED ORDER — LACTATED RINGERS IV SOLN: INTRAVENOUS | Status: DC

## 2014-05-18 MED ORDER — GLYCOPYRROLATE 0.2 MG/ML IJ SOLN
INTRAMUSCULAR | Status: AC
  Filled 2014-05-18: qty 3

## 2014-05-18 MED ORDER — MORPHINE SULFATE 2 MG/ML IJ SOLN: 1.0000 mg | INTRAMUSCULAR | Status: DC | PRN

## 2014-05-18 MED ORDER — FLEET ENEMA 7-19 GM/118ML RE ENEM: 1.0000 | ENEMA | Freq: Once | RECTAL | Status: AC | PRN

## 2014-05-18 MED ORDER — DIPHENHYDRAMINE HCL 50 MG/ML IJ SOLN: 12.5000 mg | Freq: Four times a day (QID) | INTRAMUSCULAR | Status: DC | PRN

## 2014-05-18 MED ORDER — POLYMYXIN B SULFATE 500000 UNITS IJ SOLR
INTRAMUSCULAR | Status: DC | PRN
  Administered 2014-05-18: 500 mL

## 2014-05-18 MED ORDER — CLONAZEPAM 1 MG PO TABS
1.0000 mg | ORAL_TABLET | Freq: Two times a day (BID) | ORAL | Status: DC
  Administered 2014-05-18: 1 mg via ORAL
  Filled 2014-05-18: qty 1

## 2014-05-18 MED ORDER — NEOSTIGMINE METHYLSULFATE 10 MG/10ML IV SOLN
INTRAVENOUS | Status: AC
  Filled 2014-05-18: qty 1

## 2014-05-18 SURGICAL SUPPLY — 43 items
BAG ZIPLOCK 12X15 (MISCELLANEOUS) IMPLANT
CLEANER TIP ELECTROSURG 2X2 (MISCELLANEOUS) ×2 IMPLANT
CLOTH 2% CHLOROHEXIDINE 3PK (PERSONAL CARE ITEMS) ×2 IMPLANT
DRAPE MICROSCOPE LEICA (MISCELLANEOUS) ×2 IMPLANT
DRAPE POUCH INSTRU U-SHP 10X18 (DRAPES) ×2 IMPLANT
DRAPE SURG 17X11 SM STRL (DRAPES) ×2 IMPLANT
DRAPE UTILITY XL STRL (DRAPES) ×2 IMPLANT
DRSG AQUACEL AG ADV 3.5X 4 (GAUZE/BANDAGES/DRESSINGS) ×2 IMPLANT
DRSG AQUACEL AG ADV 3.5X 6 (GAUZE/BANDAGES/DRESSINGS) IMPLANT
DURAPREP 26ML APPLICATOR (WOUND CARE) ×2 IMPLANT
DURASEAL SPINE SEALANT 3ML (MISCELLANEOUS) IMPLANT
ELECT BLADE TIP CTD 4 INCH (ELECTRODE) IMPLANT
ELECT REM PT RETURN 9FT ADLT (ELECTROSURGICAL) ×2
ELECTRODE REM PT RTRN 9FT ADLT (ELECTROSURGICAL) ×1 IMPLANT
GLOVE BIOGEL PI IND STRL 7.5 (GLOVE) ×1 IMPLANT
GLOVE BIOGEL PI INDICATOR 7.5 (GLOVE) ×1
GLOVE SURG SS PI 7.5 STRL IVOR (GLOVE) ×2 IMPLANT
GLOVE SURG SS PI 8.0 STRL IVOR (GLOVE) ×4 IMPLANT
GOWN STRL REUS W/TWL XL LVL3 (GOWN DISPOSABLE) ×4 IMPLANT
IV CATH 14GX2 1/4 (CATHETERS) IMPLANT
KIT BASIN OR (CUSTOM PROCEDURE TRAY) ×2 IMPLANT
KIT POSITIONING SURG ANDREWS (MISCELLANEOUS) ×2 IMPLANT
MANIFOLD NEPTUNE II (INSTRUMENTS) ×2 IMPLANT
NEEDLE SPNL 18GX3.5 QUINCKE PK (NEEDLE) ×4 IMPLANT
PACK LAMINECTOMY ORTHO (CUSTOM PROCEDURE TRAY) ×2 IMPLANT
PATTIES SURGICAL .5 X.5 (GAUZE/BANDAGES/DRESSINGS) IMPLANT
PATTIES SURGICAL .75X.75 (GAUZE/BANDAGES/DRESSINGS) IMPLANT
PATTIES SURGICAL 1X1 (DISPOSABLE) IMPLANT
SPONGE SURGIFOAM ABS GEL 100 (HEMOSTASIS) ×2 IMPLANT
STAPLER VISISTAT (STAPLE) IMPLANT
STRIP CLOSURE SKIN 1/2X4 (GAUZE/BANDAGES/DRESSINGS) ×2 IMPLANT
SUT NURALON 4 0 TR CR/8 (SUTURE) IMPLANT
SUT PROLENE 3 0 PS 2 (SUTURE) ×2 IMPLANT
SUT VIC AB 1 CT1 27 (SUTURE)
SUT VIC AB 1 CT1 27XBRD ANTBC (SUTURE) IMPLANT
SUT VIC AB 1-0 CT2 27 (SUTURE) ×2 IMPLANT
SUT VIC AB 2-0 CT1 27 (SUTURE)
SUT VIC AB 2-0 CT1 TAPERPNT 27 (SUTURE) IMPLANT
SUT VIC AB 2-0 CT2 27 (SUTURE) ×2 IMPLANT
SYR 3ML LL SCALE MARK (SYRINGE) IMPLANT
TOWEL OR 17X26 10 PK STRL BLUE (TOWEL DISPOSABLE) ×2 IMPLANT
TOWEL OR NON WOVEN STRL DISP B (DISPOSABLE) IMPLANT
YANKAUER SUCT BULB TIP NO VENT (SUCTIONS) IMPLANT

## 2014-05-18 NOTE — Brief Op Note (Signed)
05/18/2014  11:12 AM  PATIENT:  Heather Douglas  53 y.o. 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  POST-OPERATIVE DIAGNOSIS:  stenosis L4 - L5 on the right  PROCEDURE:  Procedure(s): MICRO LUMBAR DECOMPRESSION L4 - L5 ON THE RIGHT 1 LEVEL (Right)  SURGEON:  Surgeon(s) and Role:    * Johnn Hai, MD - Primary  PHYSICIAN ASSISTANT:   ASSISTANTS: [place]   ANESTHESIA:   general  EBL:  Total I/O In: 1000 [I.V.:1000] Out: -   BLOOD ADMINISTERED:none  DRAINS: none   LOCAL MEDICATIONS USED:  MARCAINE     SPECIMEN:  No Specimen  DISPOSITION OF SPECIMEN:  N/A  COUNTS:  YES  TOURNIQUET:  * No tourniquets in log *  DICTATION: .Other Dictation: Dictation Number [id number]  PLAN OF CARE: Admit for overnight observation  PATIENT DISPOSITION:  PACU - hemodynamically stable.   Delay start of Pharmacological VTE agent (>24hrs) due to surgical blood loss or risk of bleeding: yes

## 2014-05-18 NOTE — Discharge Instructions (Signed)
Walk As Tolerated utilizing back precautions.  No bending, twisting, or lifting.  No driving for 2 weeks.   °Aquacel dressing may remain in place until follow up. May shower with aquacel dressing in place. If the dressing peels off or becomes saturated, you may remove aquacel dressing and place gauze and tape dressing which should be kept clean and dry and changed daily. Do not remove steri-strips if they are present. °See Dr. Arryanna Holquin in office in 10 to 14 days. Begin taking aspirin 81mg per day starting 4 days after your surgery if not allergic to aspirin or on another blood thinner. °Walk daily even outside. Use a cane or walker only if necessary. °Avoid sitting on soft sofas. ° °

## 2014-05-18 NOTE — Interval H&P Note (Signed)
History and Physical Interval Note:  05/18/2014 7:07 AM  Heather Douglas  has presented today for surgery, with the diagnosis of stenosis L4 - L5 on the right  The various methods of treatment have been discussed with the patient and family. After consideration of risks, benefits and other options for treatment, the patient has consented to  Procedure(s): MICRO LUMBAR DECOMPRESSION L4 - L5 ON THE RIGHT 1 LEVEL (Right) as a surgical intervention .  The patient's history has been reviewed, patient examined, no change in status, stable for surgery.  I have reviewed the patient's chart and labs.  Questions were answered to the patient's satisfaction.     [name] C

## 2014-05-18 NOTE — Transfer of Care (Signed)
Immediate Anesthesia Transfer of Care Note  Patient: Heather Douglas  Procedure(s) Performed: Procedure(s): MICRO LUMBAR DECOMPRESSION L4 - L5 ON THE RIGHT 1 LEVEL (Right)  Patient Location: PACU  Anesthesia Type:General  Level of Consciousness: awake, alert , oriented and patient cooperative  Airway & Oxygen Therapy: Patient Spontanous Breathing and Patient connected to face mask oxygen  Post-op Assessment: Report given to PACU RN, Post -op Vital signs reviewed and stable and Patient moving all extremities  Post vital signs: Reviewed and stable  Complications: No apparent anesthesia complications

## 2014-05-18 NOTE — Anesthesia Postprocedure Evaluation (Signed)
  Anesthesia Post-op Note  Patient: Heather Douglas  Procedure(s) Performed: Procedure(s) (LRB): MICRO LUMBAR DECOMPRESSION L4 - L5 ON THE RIGHT 1 LEVEL (Right)  Patient Location: PACU  Anesthesia Type: General  Level of Consciousness: awake and alert   Airway and Oxygen Therapy: Patient Spontanous Breathing  Post-op Pain: mild  Post-op Assessment: Post-op Vital signs reviewed, Patient's Cardiovascular Status Stable, Respiratory Function Stable, Patent Airway and No signs of Nausea or vomiting  Last Vitals:  Filed Vitals:   05/18/14 1214  BP: 115/66  Pulse:   Temp: 36.7 C  Resp:     Post-op Vital Signs: stable   Complications: No apparent anesthesia complications

## 2014-05-18 NOTE — Evaluation (Signed)
Physical Therapy Evaluation Patient Details Name: Heather Douglas MRN: [id number] DOB: 04/23/1961 Today's Date: 05/18/2014   History of Present Illness  MICRO LUMBAR DECOMPRESSION L4 - L5 ON THE RIGHT 1 LEVEL (Right)  Clinical Impression  Pt s/p lumbar decompression presents with functional mobility limitations 2* post op pain and back precautions.  Pt should progress to d/c home with family assist.    Follow Up Recommendations No PT follow up    Equipment Recommendations  None recommended by PT    Recommendations for Other Services OT consult     Precautions / Restrictions Precautions Precautions: Back Restrictions Weight Bearing Restrictions: No      Mobility  Bed Mobility Overal bed mobility: Needs Assistance Bed Mobility: Supine to Sit;Sit to Supine     Supine to sit: Mod assist Sit to supine: Mod assist   General bed mobility comments: cues for log roll technique and assist to manage LEs and to control trunk   Transfers Overall transfer level: Needs assistance Equipment used: Rolling walker (2 wheeled) Transfers: Sit to/from Stand Sit to Stand: Min assist         General transfer comment: cues for transition position and use of UEs to self assist  Ambulation/Gait Ambulation/Gait assistance: Min assist;Mod assist Ambulation Distance (Feet): 5 Feet (twice) Assistive device: Rolling walker (2 wheeled) Gait Pattern/deviations: Step-to pattern;Decreased step length - right;Decreased step length - left;Shuffle Gait velocity: decr   General Gait Details: cues for posture and position from RW - to/from [facility] by c/o being lightheaded  Stairs            Wheelchair Mobility    Modified Rankin (Stroke Patients Only)       Balance                                             Pertinent Vitals/Pain Pain Assessment: 0-10 Pain Score: 5  Pain Location: back Pain Descriptors / Indicators: Sore Pain Intervention(s): Limited  activity within patient's tolerance;Monitored during session;Premedicated before session    Home Living Family/patient expects to be discharged to:: Private residence Living Arrangements: Spouse/significant other Available Help at Discharge: Family Type of Home: Apartment Home Access: Stairs to enter Entrance Stairs-Rails: Psychiatric nurse of Steps: 17 Home Layout: One level Home Equipment: None      Prior Function Level of Independence: Independent               Hand Dominance        Extremity/Trunk Assessment   Upper Extremity Assessment: Overall WFL for tasks assessed           Lower Extremity Assessment: Overall WFL for tasks assessed         Communication   Communication: No difficulties  Cognition Arousal/Alertness: Awake/alert Behavior During Therapy: WFL for tasks assessed/performed Overall Cognitive Status: Within Functional Limits for tasks assessed                      General Comments      Exercises        Assessment/Plan    PT Assessment Patient needs continued PT services  PT Diagnosis Difficulty walking   PT Problem List Decreased activity tolerance;Decreased mobility;Decreased knowledge of use of DME;Decreased knowledge of precautions;Pain  PT Treatment Interventions DME instruction;Gait training;Stair training;Functional mobility training;Therapeutic activities;Therapeutic exercise;Patient/family education   PT Goals (Current goals can be  found in the Care Plan section) Acute Rehab PT Goals Patient Stated Goal: HOME PT Goal Formulation: With patient Time For Goal Achievement: 05/24/14 Potential to Achieve Goals: Good    Frequency Min 6X/week   Barriers to discharge        Co-evaluation               End of Session   Activity Tolerance: Other (comment);Patient limited by fatigue (lightheaded) Patient left: in bed;with call bell/phone within reach;with family/visitor present Nurse  Communication: Mobility status    Functional Assessment Tool Used: clinical judgement Functional Limitation: Mobility: Walking and moving around Mobility: Walking and Moving Around Current Status (Q2229): At least 20 percent but less than 40 percent impaired, limited or restricted Mobility: Walking and Moving Around Goal Status [phone]): At least 1 percent but less than 20 percent impaired, limited or restricted    Time: 1194-1740 PT Time Calculation (min) (ACUTE ONLY): 23 min   Charges:   PT Evaluation $Initial PT Evaluation Tier I: 1 Procedure PT Treatments $Therapeutic Activity: 8-22 mins   PT G Codes:   PT G-Codes **NOT FOR INPATIENT CLASS** Functional Assessment Tool Used: clinical judgement Functional Limitation: Mobility: Walking and moving around Mobility: Walking and Moving Around Current Status (C1448): At least 20 percent but less than 40 percent impaired, limited or restricted Mobility: Walking and Moving Around Goal Status [phone]): At least 1 percent but less than 20 percent impaired, limited or restricted    [facility] 05/18/2014, 5:48 PM

## 2014-05-18 NOTE — Anesthesia Procedure Notes (Signed)
Procedure Name: Intubation Date/Time: 05/18/2014 10:04 AM Performed by: Carleene Cooper A Pre-anesthesia Checklist: Patient identified, Emergency Drugs available, Suction available, Patient being monitored and Timeout performed Patient Re-evaluated:Patient Re-evaluated prior to inductionOxygen Delivery Method: Circle system utilized Preoxygenation: Pre-oxygenation with 100% oxygen Intubation Type: IV induction Ventilation: Mask ventilation without difficulty Grade View: Grade I Tube type: Oral Tube size: 7.5 mm Number of attempts: 1 Airway Equipment and Method: Stylet Placement Confirmation: ETT inserted through vocal cords under direct vision,  positive ETCO2 and breath sounds checked- equal and bilateral Secured at: 21 cm Tube secured with: Tape Dental Injury: Teeth and Oropharynx as per pre-operative assessment

## 2014-05-18 NOTE — Anesthesia Preprocedure Evaluation (Addendum)
Anesthesia Evaluation  Patient identified by MRN, date of birth, ID band Patient awake    Reviewed: Allergy & Precautions, H&P , NPO status , Patient's Chart, lab work & pertinent test results  Airway Mallampati: II  TM Distance: >3 FB Neck ROM: full    Dental no notable dental hx. (+) Teeth Intact, Dental Advisory Given   Pulmonary neg pulmonary ROS, former smoker,  breath sounds clear to auscultation  Pulmonary exam normal       Cardiovascular Exercise Tolerance: Good + Valvular Problems/Murmurs MR Rhythm:regular Rate:Normal     Neuro/Psych negative neurological ROS  negative psych ROS   GI/Hepatic negative GI ROS, Neg liver ROS,   Endo/Other  negative endocrine ROS  Renal/GU negative Renal ROS  negative genitourinary   Musculoskeletal  (+) Arthritis -, Rheumatoid disorders,    Abdominal   Peds  Hematology negative hematology ROS (+)   Anesthesia Other Findings   Reproductive/Obstetrics negative OB ROS                            Anesthesia Physical Anesthesia Plan  ASA: II  Anesthesia Plan: General   Post-op Pain Management:    Induction: Intravenous  Airway Management Planned: Oral ETT  Additional Equipment:   Intra-op Plan:   Post-operative Plan: Extubation in OR  Informed Consent: I have reviewed the patients History and Physical, chart, labs and discussed the procedure including the risks, benefits and alternatives for the proposed anesthesia with the patient or authorized representative who has indicated his/her understanding and acceptance.   Dental Advisory Given  Plan Discussed with: CRNA and Surgeon  Anesthesia Plan Comments:         Anesthesia Quick Evaluation

## 2014-05-18 NOTE — H&P (View-Only) (Signed)
Heather Douglas is an 53 y.o. female.   Chief Complaint: back and leg pain HPI: The patient is a 53 year old female who presents today for follow up of their back. The patient is being followed for their low back symptoms. They are now year(s) out from when symptoms began. Symptoms reported today include: pain. The patient states that they are doing poorly. Current treatment includes: relative rest, NSAIDs and heat. The following medication has been used for pain control: ibuprofen. The patient presents today following lumbar CT/Myelogram.  Heather Douglas follows up with a CT myelogram. The myelogram indicates, when standing and leaning to the right, there is some lateral recess stenosis that may affect the L5 nerve root. She's having really no left-sided symptoms, mainly right-sided symptoms. She does have some arthritic changes in her SI joint, left greater than right. She's on Methotrexate. According to her and her husband, she does not have a diagnosis of rheumatoid arthritis, mainly osteoarthritis, and it is in her knees as well. She's had cortisone injections there. She did get an epidural at 5-1 on the right. She had 90% of relief of pain for awhile, then it returned.  Past Medical History  Diagnosis Date  . Hyperlipidemia   . Mitral valve regurgitation   . Rheumatoid arthritis   . Uterine cancer     Past Surgical History  Procedure Laterality Date  . Abdominal hysterectomy      No family history on file. Social History:  reports that she has been smoking Cigarettes.  She has been smoking about 2.00 packs per day. She has never used smokeless tobacco. She reports that she does not drink alcohol or use illicit drugs.  Allergies:  Allergies  Allergen Reactions  . Dilaudid [Hydromorphone Hcl] Itching  . Erythromycin Other (See Comments)    "Severe GI distress"  . Bee Venom   . Valium [Diazepam] Other (See Comments)    "makes me nutty"     (Not in a hospital admission)  No  results found for this or any previous visit (from the past 48 hour(s)). No results found.  Review of Systems  Constitutional: Negative.   HENT: Negative.   Eyes: Negative.   Respiratory: Negative.   Cardiovascular: Negative.   Gastrointestinal: Negative.   Genitourinary: Negative.   Musculoskeletal: Positive for back pain.  Skin: Negative.   Neurological: Positive for focal weakness.  Psychiatric/Behavioral: Negative.     There were no vitals taken for this visit. Physical Exam  Constitutional: She is oriented to person, place, and time. She appears well-developed and well-nourished.  HENT:  Head: Normocephalic and atraumatic.  Eyes: Conjunctivae and EOM are normal. Pupils are equal, round, and reactive to light.  Neck: Normal range of motion. Neck supple.  Cardiovascular: Normal rate and regular rhythm.   Respiratory: Effort normal and breath sounds normal.  GI: Soft. Bowel sounds are normal.  Musculoskeletal:  On exam, her SLR produces buttock pain, moderate thigh pain. EHL is 4+/5 on the right compared to the left. Pain with extension and flexion of the lumbar spine. Remainder of LS spine is normal outside the knee exam.  Lumbar spine exam reveals no evidence of soft tissue swelling, ecchymosis or deformity. The abdomen is soft and nontender. Nontender over the trochanters. No cellulitis or lymphadenopathy.  Motor is 5/5 including tibialis anterior, plantar flexion, quadriceps and hamstrings. Patient is normoreflexic. There is no Babinski or clonus. Sensory exam is intact to light touch. The patient has good distal pulses. No DVT. No  pain and normal range of motion without instability of the hips, knees and ankles.  Neurological: She is alert and oriented to person, place, and time. She has normal reflexes.  Skin: Skin is warm and dry.  Psychiatric: She has a normal mood and affect.    MRI demonstrates mild lateral recess stenosis at 4-5 on the right, but no definitive  neurocompression.   The myelogram indicates, when standing and leaning to the right, there is some lateral recess stenosis that may affect the L5 nerve root. She's having really no left-sided symptoms, mainly right-sided symptoms. She does have some arthritic changes in her SI joint, left greater than right.  Assessment/Plan 1. 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 She had 90% temporary relief from an epidural at 4-5 to the right. 2. Underlying mild SI joint arthrosis. 3. Osteoarthritis of her knees.  I had an extensive discussion concerning her current pathology, relevant anatomy and treatment options in front of her husband, reviewing her anatomy, using models and reviewing the MRI, explaining the differential diagnosis and confirmation with the CT myelogram and the epidural results. Given the temporary relief, I do feel that at least some of her pain is dynamic. No compression of the 5 root. Certainly, a laminectomy at 4-5 and decompression of the lateral recess is an option. The other is to live with her symptoms, additional injections, etc.  Risks and benefits of this procedure were discussed with the patient including worsening of symptoms, no changes in symptoms, recurrent disc herniation, scar tissue, epidural fibrosis, damage to neurovascular structures, cerebral spinal fluid leak which would require repair or patching, DVT, PE, anesthetic complications, etc. We discussed the perioperative course, the hospitalization, and the need for postoperative rehabilitation and the time estimate for recovery. We also discussed the possibility of future surgery including repeat decompression, fusion. The patient was provided an illustrated handout which was discussed in detail. Appropriate anatomic models were used as well.  I may also try SI joint injections in the future, but without  significant relief from an epidural, I do feel this is a pain generator, or at least one of them. She has multiple other issues, and we discussed overlapping pain patterns; however, this is certainly an option. She would like to proceed with that. I discussed it with her and her husband. Provided her with an illustrated handout and discussed it in detail. She has Methotrexate. I asked her to call Dr. Amil Amen as to whether we can discontinue that perioperatively. We will set her up accordingly. She, in the interim, is to avoid extension and flexion, avoid unsupported bending. She also takes 7 mg. of Prednisone which she can continue. With any questions, they can call.  Plan microlumbar decompression L4-5 right  BISSELL, JACLYN M. PA-C for Dr. Tonita Cong 04/28/2014, 9:09 AM

## 2014-05-18 NOTE — Op Note (Signed)
NAMECAMBELL, RICKENBACH               ACCOUNT NO.:  [id number]  MEDICAL RECORD NO.:  [id number]  LOCATION:  5573                         FACILITY:  [facility]  PHYSICIAN:  Susa Day, M.D.    DATE OF BIRTH:  Jul 22, 1960  DATE OF PROCEDURE:  05/18/2014 DATE OF DISCHARGE:                              OPERATIVE REPORT   PREOPERATIVE DIAGNOSIS:  Spinal stenosis, L4-5 right.  POSTOPERATIVE DIAGNOSIS:  Spinal stenosis, L4-5 right.  PROCEDURE PERFORMED:  Micro lumbar decompression, L4-5 right; foraminotomies of L4-L5, right.  ANESTHESIA:  General.  ASSISTANT:  Cleophas Dunker, PA  HISTORY:  A 53 year old with a right lower extremity radicular pain in L5 nerve root distribution.  Spinal stenosis.  Myelogram indicated lateral recess stenosis.  She had EHL weakness and neurotension signs. She was indicated for decompression of 5 root by micro lumbar decompression and possible for.  Risks and benefits were discussed including bleeding, infection, damage to neurovascular structures, DVT, PE, anesthetic complications, etc.  TECHNIQUE:  With the patient in supine position, after induction of adequate general anesthesia, 2 g Kefzol, and 900 clindamycin.  Placed prone on the [place] frame.  All bony prominences well padded.  Lumbar region was prepped and draped in usual sterile fashion.  Two 18-gauge spinal needles were utilized to localize L4-5 interspace, confirmed with x-ray.  Incision was made from spinous process 4-5.  Subcutaneous tissue was dissected.  Cautery was utilized to achieve hemostasis.  Paraspinous muscles were elevated.  Self-retaining retractor was placed. Confirmatory radiograph obtained.  Operating microscope was draped and brought on the surgical field.  Very small interlaminar window was noted due to the facet hypertrophy.  Detached the ligamentum flavum from the cephalad edge of 5 utilizing a straight curette.  Hemilaminotomy of the caudad edge of L4 was performed with  a 2 and a 3 mm Kerrison detaching the ligamentum flavum preserving the pars.  We removed ligamentum flavum from the interspace with the neuro patty placed beneath the ligamentum. Hypertrophic venous plexus was noted, facet hypertrophy compressing the 5 root in the lateral recess.  I performed a generous foraminotomy of 5, identified the 5 root, gently mobilized it medially.  I then decompressed lateral recess to the medial border of the pedicle. Hypertrophic ligamentum was noted as well performed foraminotomy of 4. There was no disk herniation noted.  Bipolar electrocautery was utilized to achieve hemostasis.  We obtained a confirmatory radiograph at the 4-5 space.  The neuro probe was passed freely up the foramen of 4 and 5.  We checked beneath thecal sac, the axilla of the root, the shoulder of the root, and both foramen of 4 and 5.  No residual compression.  There was 1 cm of excursion of the 5 root medial to the pedicle without tension. Copiously irrigated the space.  No evidence of CSF leakage or active bleeding.  We removed the [facility] retractor, irrigated the paraspinous musculature, closed with 1 Vicryl, subcu with 2-0 and skin with Prolene.  Sterile dressing applied.  Placed supine on the hospital bed, extubated without difficulty, and transported to the recovery room in satisfactory condition.  The patient tolerated the procedure well.  No complications.  No blood loss.  Susa Day, M.D.     Geralynn Rile  D:  05/18/2014  T:  05/18/2014  Job:  [id number]

## 2014-05-19 DIAGNOSIS — M4806 Spinal stenosis, lumbar region: Secondary | ICD-10-CM | POA: Diagnosis not present

## 2014-05-19 MED ORDER — IBUPROFEN 800 MG PO TABS: 800.0000 mg | ORAL_TABLET | Freq: Three times a day (TID) | ORAL | Status: DC | PRN

## 2014-05-19 NOTE — Evaluation (Signed)
Occupational Therapy Evaluation Patient Details Name: Heather Douglas MRN: [id number] DOB: 1960-05-27 Today's Date: 05/19/2014    History of Present Illness MICRO LUMBAR DECOMPRESSION L4 - L5 ON THE RIGHT 1 LEVEL (Right)   Clinical Impression   Pt limited by pain. Nursing in at end of session to discuss pain medicine schedule with pt/spouse. Pt having some difficulty with sit to stand during OT session but pt states husband can provide the min assist. Spouse confirms he can assist PRN. Educated extensively on benefits of 3in1 and able to obtain one for d/c. Spouse plans to assist with LB self care and toileting but did educate on AE options for toileting. Pt to d/c today. Reviewed all precautions.    Follow Up Recommendations  No OT follow up;Supervision/Assistance - 24 hour    Equipment Recommendations  3 in 1 bedside comode    Recommendations for Other Services       Precautions / Restrictions Precautions Precautions: Back Precaution Booklet Issued: Yes (comment) Precaution Comments: Reviewed all precautions with pt and spouse Restrictions Weight Bearing Restrictions: No      Mobility Bed Mobility Overal bed mobility: Needs Assistance Bed Mobility: Sit to Sidelying        Sit to sidelying: Min guard General bed mobility comments: verbal cues for technique for sit to side lying.  Transfers Overall transfer level: Needs assistance Equipment used: None Transfers: Sit to/from Stand Sit to Stand: Min assist         General transfer comment: pt with difficulty with sit to stand due to pain. needed min assist to steady and verbal cues for back precautions. pt states her knees are weak.     Balance                                            ADL Overall ADL's : Needs assistance/impaired Eating/Feeding: Independent;Sitting   Grooming: Wash/dry hands;Min guard;Standing   Upper Body Bathing: Set up;Supervision/ safety;Sitting   Lower Body Bathing:  Moderate assistance;Sit to/from stand   Upper Body Dressing : Supervision/safety;Set up;Sitting   Lower Body Dressing: Moderate assistance;Sit to/from stand   Toilet Transfer: Minimal assistance;Ambulation;BSC   Toileting- Clothing Manipulation and Hygiene: Total assistance;Sit to/from stand         General ADL Comments: Pt limited by pain this session. Pt didnt assign a number to pain despite several times requesting her to rate pain but she did say she had pain/pressure at her incision site. Informed nursing. Husband present for session. Pt stating she would obtain a riser on the way home at a drug store. Discussed 3in1 option and how this adjusts so that pt's feet can be as flat on the floor as possible. Pt uncomfortable sitting on 3in1 here as she states her feet arent supported enough . Again explained the 3in1 can adjust to go lower over her standard toilet and that our toilets are higher so they cant go down but so far. Explained she may like 3in1 option better to adjust 3in1 height than the riser that is the height it is and her feet may not touch the floor. Able to obtain a 3in1 from nursing who states case manager can contact to arrange payment. Pt doesnt feel ready to step into tub and is having too much pain to safely attempt stepping over right now. discussed sponge bathing until pt able to pick Les up enough  to clear side of tub. Demostrated technique of side stepping into tub when ready. Pt not abe to perform toilet hygiene without breaking precautions so educated on toilet aid option versus pt states can assist also. reviewed back care handout. Pt very anxious about pain and how to move and not break her back precautions. Attempted to provide encouragement and reinforcement.      Vision                     Perception     Praxis      Pertinent Vitals/Pain Pain Assessment: 0-10 Pain Score: 5  Pain Location: pt didnt rate pain but grimacing with movement. Pain  Descriptors / Indicators: Aching Pain Intervention(s): Repositioned     Hand Dominance     Extremity/Trunk Assessment Upper Extremity Assessment Upper Extremity Assessment: Overall WFL for tasks assessed           Communication Communication Communication: No difficulties   Cognition Arousal/Alertness: Awake/alert Behavior During Therapy: Anxious Overall Cognitive Status: Within Functional Limits for tasks assessed                     General Comments       Exercises       Shoulder Instructions      Home Living Family/patient expects to be discharged to:: Private residence Living Arrangements: Spouse/significant other Available Help at Discharge: Family Type of Home: Apartment Home Access: Stairs to enter Technical brewer of Steps: 17 Entrance Stairs-Rails: Right;Left Home Layout: One level     Bathroom Shower/Tub: Teacher, early years/pre: Standard     Home Equipment: None          Prior Functioning/Environment Level of Independence: Independent             OT Diagnosis: Generalized weakness;Acute pain   OT Problem List:     OT Treatment/Interventions:      OT Goals(Current goals can be found in the care plan section) Acute Rehab OT Goals Patient Stated Goal: HOME OT Goal Formulation: With patient/family  OT Frequency:     Barriers to D/C:            Co-evaluation              End of Session    Activity Tolerance: Patient limited by pain Patient left: in bed;with call bell/phone within reach;with family/visitor present   Time: 1023-1059 OT Time Calculation (min): 36 min Charges:  OT Evaluation $Initial OT Evaluation Tier I: 1 Procedure OT Treatments $Self Care/Home Management : 8-22 mins $Therapeutic Activity: 8-22 mins G-Codes: OT G-codes **NOT FOR INPATIENT CLASS** Functional Assessment Tool Used: clinical judgement Functional Limitation: Self care Self Care Current Status (Z6109): At least 40  percent but less than 60 percent impaired, limited or restricted Self Care Goal Status (U0454): At least 40 percent but less than 60 percent impaired, limited or restricted Self Care Discharge Status [phone]): At least 40 percent but less than 60 percent impaired, limited or restricted  [place], [place] 05/19/2014, 1:11 PM

## 2014-05-19 NOTE — Progress Notes (Signed)
Heather Douglas  MRN: [id number] DOB/Age: [birth year] 54 y.o. Physician: Rada Hay Procedure: Procedure(s) (LRB): MICRO LUMBAR DECOMPRESSION L4 - L5 ON THE RIGHT 1 LEVEL (Right)     Subjective: Patient still with fair amount of right hip pain, requesting toradol and ibuprofen to go home. Up walking with therapy  Vital Signs Temp:  [97.5 F (36.4 C)-98.1 F (36.7 C)] 98.1 F (36.7 C) (01/01 0345) Pulse Rate:  [57-95] 64 (01/01 0345) Resp:  [14-20] 20 (01/01 0345) BP: (94-147)/(39-67) 94/48 mmHg (01/01 0345) SpO2:  [96 %-100 %] 97 % (01/01 0345) Weight:  [91.173 kg (201 lb)] 91.173 kg (201 lb) (12/31 1328)  Lab Results No results for input(s): WBC, HGB, HCT, PLT in the last 72 hours. BMET No results for input(s): NA, K, CL, CO2, GLUCOSE, BUN, CREATININE, CALCIUM in the last 72 hours. No results found for: INR   Exam Moving legs well. Pt with EHL now 4+ which is improved from preop according tp pt        Plan Discharge home rx left for ibuprofen 800 per patient request Instructions in chart  [facility] for [place] 05/19/2014, 10:00 AM

## 2014-05-19 NOTE — Progress Notes (Signed)
Pt to d/c home. DME (3n1) given to patient before d/c. AVS reviewed and "My Chart" discussed with pt. Educated patient profusely about pain medication and taking something when she needs it. Patient is very hesitant about taking narcotics and waits too long to take something, resulting in intolerable pain that makes her tearful. Patient verbalizes understanding about the need for medication and appropriate pain control measures. Pt capable of verbalizing all other medications, signs and symptoms of infection, and follow-up appointments. Remains hemodynamically stable. Educated pt to return to ER in the case of SOB, dizziness, or chest pain.

## 2014-05-19 NOTE — Progress Notes (Signed)
Physical Therapy Treatment Patient Details Name: Heather Douglas MRN: [id number] DOB: 03-21-1961 Today's Date: 05/19/2014    History of Present Illness MICRO LUMBAR DECOMPRESSION L4 - L5 ON THE RIGHT 1 LEVEL (Right)    PT Comments    Good progress from yesterday.  Pt apprehensive but eager for return home.  Follow Up Recommendations  No PT follow up     Equipment Recommendations  None recommended by PT    Recommendations for Other Services OT consult     Precautions / Restrictions Precautions Precautions: Back Precaution Booklet Issued: Yes (comment) Precaution Comments: Reviewed all precautions with pt and spouse Restrictions Weight Bearing Restrictions: No    Mobility  Bed Mobility Overal bed mobility: Needs Assistance Bed Mobility: Supine to Sit     Supine to sit: Min guard     General bed mobility comments: cues for log roll technique and assist to manage LEs and to control trunk   Transfers Overall transfer level: Needs assistance Equipment used: None Transfers: Sit to/from Stand Sit to Stand: Min guard         General transfer comment: cues for transition position and use of UEs to self assist  Ambulation/Gait Ambulation/Gait assistance: Min guard;Supervision Ambulation Distance (Feet): 350 Feet Assistive device: Rolling walker (2 wheeled);None Gait Pattern/deviations: Step-through pattern;Decreased step length - right;Decreased step length - left;Shuffle;Trunk flexed;Narrow base of support Gait velocity: decr   General Gait Details: cues for posture and pace.  Pt ambulated 200' with RW and 100' sans AD   Stairs Stairs: Yes Stairs assistance: Min assist Stair Management: One rail Left;Forwards;Step to pattern Number of Stairs: 4 General stair comments: cues for sequence   Wheelchair Mobility    Modified Rankin (Stroke Patients Only)       Balance                                    Cognition Arousal/Alertness:  Awake/alert Behavior During Therapy: WFL for tasks assessed/performed Overall Cognitive Status: Within Functional Limits for tasks assessed                      Exercises      General Comments        Pertinent Vitals/Pain Pain Assessment: 0-10 Pain Score: 5  Pain Location: back Pain Descriptors / Indicators: Sore Pain Intervention(s): Limited activity within patient's tolerance;Monitored during session;Premedicated before session    Home Living                      Prior Function            PT Goals (current goals can now be found in the care plan section) Acute Rehab PT Goals Patient Stated Goal: HOME PT Goal Formulation: With patient Time For Goal Achievement: 05/24/14 Potential to Achieve Goals: Good Progress towards PT goals: Progressing toward goals    Frequency  Min 6X/week    PT Plan Current plan remains appropriate    Co-evaluation             End of Session   Activity Tolerance: Patient tolerated treatment well Patient left: in chair;with call bell/phone within reach;with family/visitor present     Time: 3818-2993 PT Time Calculation (min) (ACUTE ONLY): 34 min  Charges:  $Gait Training: 8-22 mins $Therapeutic Activity: 8-22 mins  G Codes:      Minaal Struckman 2014/05/29, 11:38 AM

## 2014-05-22 ENCOUNTER — Encounter (HOSPITAL_COMMUNITY): Payer: Self-pay | Admitting: Specialist

## 2014-05-22 NOTE — Discharge Summary (Signed)
Physician Discharge Summary   Patient ID: Heather Douglas MRN: [id number] DOB/AGE: 10-06-1960 55 y.o.  Admit date: 05/18/2014 Discharge date: 05/22/2014  Primary Diagnosis:   stenosis L4 - L5 on the right  Admission Diagnoses:  Past Medical History  Diagnosis Date  . Hyperlipidemia   . Mitral valve regurgitation   . Rheumatoid arthritis   . Uterine cancer   . Complication of anesthesia     hard time waking back up   . Family history of adverse reaction to anesthesia     sister also had hard time waking up   . Dysrhythmia   . Heart murmur   . Depression   . Anxiety   . Anginal pain     seen in ER 06/2013 pt states was informed non cardiac stated was ruled to be an esophageal spasm   . History of ear infections     hx of in childhood hx of perorated eardrums bilat   . Cataracts, bilateral    Discharge Diagnoses:   Active Problems:   Spinal stenosis at L4-L5 level  Procedure:  Procedure(s) (LRB): MICRO LUMBAR DECOMPRESSION L4 - L5 ON THE RIGHT 1 LEVEL (Right)   Consults: None  HPI:  see H&P    Laboratory Data: Hospital Outpatient Visit on 05/09/2014  Component Date Value Ref Range Status  . MRSA, PCR 05/09/2014 NEGATIVE  NEGATIVE Final  . Staphylococcus aureus 05/09/2014 POSITIVE* NEGATIVE Final   Comment:        The Xpert SA Assay (FDA approved for NASAL specimens in patients over 57 years of age), is one component of a comprehensive surveillance program.  Test performance has been validated by [employer] for patients greater than or equal to 44 year old. It is not intended to diagnose infection nor to guide or monitor treatment.   . Sodium 05/09/2014 138  135 - 145 mmol/L Final   Please note change in reference range.  . Potassium 05/09/2014 3.9  3.5 - 5.1 mmol/L Final   Please note change in reference range.  . Chloride 05/09/2014 103  96 - 112 mEq/L Final  . CO2 05/09/2014 27  19 - 32 mmol/L Final  . Glucose, Bld 05/09/2014 108* 70 - 99 mg/dL Final    . BUN 37/37/3546 15  6 - 23 mg/dL Final  . Creatinine, Ser 05/09/2014 0.73  0.50 - 1.10 mg/dL Final  . Calcium 14/99/3193 10.1  8.4 - 10.5 mg/dL Final  . GFR calc non Af Amer 05/09/2014 >90  >90 mL/min Final  . GFR calc Af Amer 05/09/2014 >90  >90 mL/min Final   Comment: (NOTE) The eGFR has been calculated using the CKD EPI equation. This calculation has not been validated in all clinical situations. eGFR's persistently <90 mL/min signify possible Chronic Kidney Disease.   . Anion gap 05/09/2014 8  5 - 15 Final  . WBC 05/09/2014 10.7* 4.0 - 10.5 K/uL Final  . RBC 05/09/2014 4.64  3.87 - 5.11 MIL/uL Final  . Hemoglobin 05/09/2014 13.3  12.0 - 15.0 g/dL Final  . HCT 65/83/6494 41.7  36.0 - 46.0 % Final  . MCV 05/09/2014 89.9  78.0 - 100.0 fL Final  . MCH 05/09/2014 28.7  26.0 - 34.0 pg Final  . MCHC 05/09/2014 31.9  30.0 - 36.0 g/dL Final  . RDW 02/70/0432 13.7  11.5 - 15.5 % Final  . Platelets 05/09/2014 267  150 - 400 K/uL Final   No results for input(s): HGB in the last 72 hours. No results  for input(s): WBC, RBC, HCT, PLT in the last 72 hours. No results for input(s): NA, K, CL, CO2, BUN, CREATININE, GLUCOSE, CALCIUM in the last 72 hours. No results for input(s): LABPT, INR in the last 72 hours.  X-Rays:Dg Lumbar Spine 2-3 Views  05/09/2014   CLINICAL DATA:  Lumbar stenosis.  EXAM: LUMBAR SPINE - 2-3 VIEW  COMPARISON:  None.  FINDINGS: Paraspinal soft tissues are normal. No acute or focal bony abnormality. Normal mineralization and alignment. Aortoiliac atherosclerotic vascular disease. Pelvic phleboliths. Surgical clip right lower quadrant.  IMPRESSION: No acute or focal abnormality identified.   Electronically Signed   By: Maisie Fus  Register   On: 05/09/2014 09:33   Dg Spine Portable 1 View  05/18/2014   CLINICAL DATA:  Lumbar laminectomy/micro discectomy L4-5  EXAM: PORTABLE SPINE - 1 VIEW  COMPARISON:  Study obtained earlier in the day  FINDINGS: This cross-table lateral lumbar  images labeled image 3. The more superiorly located metallic probe tip is posterior to the inferior most aspect of the L4 vertebral body. The more posteriorly placed probe tip is posterior to the inferior most aspect of the L5 vertebral body. No fracture or spondylolisthesis. Disc spaces appear intact. There is atherosclerotic change in the aorta.  IMPRESSION: Metallic probe tips are posterior to the inferior aspect of the L4 vertebral body and the inferior aspect of the L5 vertebral body respectively.   Electronically Signed   By: Bretta Bang M.D.   On: 05/18/2014 11:19   Dg Spine Portable 1 View  05/18/2014   CLINICAL DATA:  Lumbar decompression and Microdiscectomy L4-5  EXAM: PORTABLE SPINE - 1 VIEW  COMPARISON:  Study obtained earlier in the day  FINDINGS: Cross-table lateral lumbar spine image is labeled image 2. The metallic probe tip is posterior to the midportion of the L5 vertebral body. There is no fracture or spondylolisthesis. Disc spaces appear intact. There is atherosclerotic change in the aorta.  IMPRESSION: Metallic probe tip is posterior to the midportion of the L5 vertebral body.   Electronically Signed   By: Bretta Bang M.D.   On: 05/18/2014 10:41   Dg Spine Portable 1 View  05/18/2014   CLINICAL DATA:  For lumbar laminectomy and decompression  EXAM: PORTABLE SPINE - 1 VIEW  COMPARISON:  Lumbar spine films of 05/09/2014 and CT lumbar spine of 12/12/2013  FINDINGS: The lumbar vertebrae are unchanged in alignment. Two needles are positioned posteriorly. The more cephalad needle is directed toward the spinous process posteriorly of L4, with the more caudal needed directed toward the spinous process of L5 posteriorly.  IMPRESSION: Needles directed toward the spinous processes of L4 and L5 posteriorly.   Electronically Signed   By: Dwyane Dee M.D.   On: 05/18/2014 10:31    EKG: Orders placed or performed during the hospital encounter of 07/01/13  . EKG 12-Lead  . EKG 12-Lead    . ED EKG (<18mins upon arrival to the ED)  . ED EKG (<92mins upon arrival to the ED)  . EKG     Hospital Course: Patient was admitted to [facility] and taken to the OR and underwent the above state procedure without complications.  Patient tolerated the procedure well and was later transferred to the recovery room and then to the orthopaedic floor for postoperative care.  They were given PO and IV analgesics for pain control following their surgery.  They were given 24 hours of postoperative antibiotics.   PT was consulted postop to assist with  mobility and transfers.  The patient was allowed to be WBAT with therapy and was taught back precautions. Discharge planning was consulted to help with postop disposition and equipment needs.  Patient had a good night on the evening of surgery and started to get up OOB with therapy on day one. Patient was seen in rounds and was ready to go home on day one.  They were given discharge instructions and dressing directions.  They were instructed on when to follow up in the office with Dr. Tonita Cong.   Diet: Regular diet Activity:WBAT; Lspine precautions Follow-up:in 10-14 days Disposition - Home Discharged Condition: good   Discharge Instructions    Discharge patient    Complete by:  As directed             Medication List    TAKE these medications        aspirin 81 MG tablet  Take 1 tablet (81 mg total) by mouth daily. Resume 4 days post-op     atorvastatin 40 MG tablet  Commonly known as:  LIPITOR  Take 40 mg by mouth daily.     clonazePAM 1 MG tablet  Commonly known as:  KLONOPIN  Take 1 mg by mouth 2 (two) times daily.     docusate sodium 100 MG capsule  Commonly known as:  COLACE  Take 1 capsule (100 mg total) by mouth 2 (two) times daily as needed for mild constipation.     folic acid 1 MG tablet  Commonly known as:  FOLVITE  Take 1 mg by mouth daily.     ibuprofen 800 MG tablet  Commonly known as:  ADVIL,MOTRIN  Take 1  tablet (800 mg total) by mouth every 8 (eight) hours as needed.     methocarbamol 500 MG tablet  Commonly known as:  ROBAXIN  Take 1 tablet (500 mg total) by mouth every 8 (eight) hours as needed for muscle spasms.     oxyCODONE-acetaminophen 5-325 MG per tablet  Commonly known as:  PERCOCET  Take 1 tablet by mouth every 4 (four) hours as needed.     predniSONE 5 MG tablet  Commonly known as:  DELTASONE  Take 5 mg by mouth daily with breakfast.     sertraline 100 MG tablet  Commonly known as:  ZOLOFT  Take 100 mg by mouth at bedtime.     traZODone 150 MG tablet  Commonly known as:  DESYREL  Take 150 mg by mouth at bedtime as needed for sleep.           Follow-up Information    Follow up with BEANE,JEFFREY C, MD In 2 weeks.   Specialty:  Orthopedic Surgery   Contact information:   [address] [place] [address] [phone]       Signed: Lacie Draft, PA-C Orthopaedic Surgery 05/22/2014, 8:31 AM

## 2014-10-22 ENCOUNTER — Emergency Department (INDEPENDENT_AMBULATORY_CARE_PROVIDER_SITE_OTHER)
Admission: EM | Admit: 2014-10-22 | Discharge: 2014-10-22 | Disposition: A | Discharge status: Home / Self Care | Payer: BC Managed Care – PPO | Source: Home / Self Care | Attending: Family Medicine | Admitting: Family Medicine

## 2014-10-22 ENCOUNTER — Encounter (HOSPITAL_COMMUNITY): Payer: Self-pay | Admitting: Emergency Medicine

## 2014-10-22 DIAGNOSIS — W57XXXA Bitten or stung by nonvenomous insect and other nonvenomous arthropods, initial encounter: Secondary | ICD-10-CM | POA: Diagnosis not present

## 2014-10-22 DIAGNOSIS — S1096XA Insect bite of unspecified part of neck, initial encounter: Secondary | ICD-10-CM

## 2014-10-22 MED ORDER — TRIAMCINOLONE ACETONIDE 0.1 % EX CREA: 1.0000 "application " | TOPICAL_CREAM | Freq: Two times a day (BID) | CUTANEOUS | Status: DC

## 2014-10-22 NOTE — Discharge Instructions (Signed)
Insect Bite Mosquitoes, flies, fleas, bedbugs, and many other insects can bite. Insect bites are different from insect stings. A sting is when venom is injected into the skin. Some insect bites can transmit infectious diseases. SYMPTOMS  Insect bites usually turn red, swell, and itch for 2 to 4 days. They often go away on their own. TREATMENT  Your caregiver may prescribe antibiotic medicines if a bacterial infection develops in the bite. HOME CARE INSTRUCTIONS  Do not scratch the bite area.  Keep the bite area clean and dry. Wash the bite area thoroughly with soap and water.  Put ice or cool compresses on the bite area.  Put ice in a plastic bag.  Place a towel between your skin and the bag.  Leave the ice on for 20 minutes, 4 times a day for the first 2 to 3 days, or as directed.  You may apply a baking soda paste, cortisone cream, or calamine lotion to the bite area as directed by your caregiver. This can help reduce itching and swelling.  Only take over-the-counter or prescription medicines as directed by your caregiver.  If you are given antibiotics, take them as directed. Finish them even if you start to feel better. You may need a tetanus shot if:  You cannot remember when you had your last tetanus shot.  You have never had a tetanus shot.  The injury broke your skin. If you get a tetanus shot, your arm may swell, get red, and feel warm to the touch. This is common and not a problem. If you need a tetanus shot and you choose not to have one, there is a rare chance of getting tetanus. Sickness from tetanus can be serious. SEEK IMMEDIATE MEDICAL CARE IF:  1. You have increased pain, redness, or swelling in the bite area. 2. You see a red line on the skin coming from the bite. 3. You have a fever. 4. You have joint pain. 5. You have a headache or neck pain. 6. You have unusual weakness. 7. You have a rash. 8. You have chest pain or shortness of breath. 9. You have  abdominal pain, nausea, or vomiting. 10. You feel unusually tired or sleepy. MAKE SURE YOU:   Understand these instructions.  Will watch your condition.  Will get help right away if you are not doing well or get worse. Document Released: 06/12/2004 Document Revised: 07/28/2011 Document Reviewed: 12/04/2010 [facility] Patient Information 2015 [place], [place]. This information is not intended to replace advice given to you by your health care provider. Make sure you discuss any questions you have with your health care provider.  Lyme Disease You may have been bitten by a tick and are to watch for the development of Lyme Disease. Lyme Disease is an infection that is caused by a bacteria The bacteria causing this disease is named Borreilia burgdorferi. If a tick is infected with this bacteria and then bites you, then Lyme Disease may occur. These ticks are carried by deer and rodents such as rabbits and mice and infest grassy as well as forested areas. Fortunately most tick bites do not cause Lyme Disease.  Lyme Disease is easier to prevent than to treat. First, covering your legs with clothing when walking in areas where ticks are possibly abundant will prevent their attachment because ticks tend to stay within inches of the ground. Second, using insecticides containing DEET can be applied on skin or clothing. Last, because it takes about 12 to 24 hours for the  tick to transmit the disease after attachment to the human host, you should inspect your body for ticks twice a day when you are in areas where Lyme Disease is common. You must look thoroughly when searching for ticks. The Ixodes tick that carries Lyme Disease is very small. It is around the size of a sesame seed (picture of tick is not actual size). Removal is best done by grasping the tick by the head and pulling it out. Do not to squeeze the body of the tick. This could inject the infecting bacteria into the bite site. Wash the area of the bite  with an antiseptic solution after removal.  Lyme Disease is a disease that may affect many body systems. Because of the small size of the biting tick, most people do not notice being bitten. The first sign of an infection is usually a round red rash that extends out from the center of the tick bite. The center of the lesion may be blood colored (hemorrhagic) or have tiny blisters (vesicular). Most lesions have bright red outer borders and partial central clearing. This rash may extend out many inches in diameter, and multiple lesions may be present. Other symptoms such as fatigue, headaches, chills and fever, general achiness and swelling of lymph glands may also occur. If this first stage of the disease is left untreated, these symptoms may gradually resolve by themselves, or progressive symptoms may occur because of spread of infection to other areas of the body.  Follow up with your caregiver to have testing and treatment if you have a tick bite and you develop any of the above complaints. Your caregiver may recommend preventative (prophylactic) medications which kill bacteria (antibiotics). Once a diagnosis of Lyme Disease is made, antibiotic treatment is highly likely to cure the disease. Effective treatment of late stage Lyme Disease may require longer courses of antibiotic therapy.  MAKE SURE YOU:   Understand these instructions.  Will watch your condition.  Will get help right away if you are not doing well or get worse. Document Released: 08/11/2000 Document Revised: 07/28/2011 Document Reviewed: 10/13/2008 [facility] Patient Information 2015 [place], [place]. This information is not intended to replace advice given to you by your health care provider. Make sure you discuss any questions you have with your health care provider.  Tick Bite Information Ticks are insects that attach themselves to the skin and draw blood for food. There are various types of ticks. Common types include wood ticks and  deer ticks. Most ticks live in shrubs and grassy areas. Ticks can climb onto your body when you make contact with leaves or grass where the tick is waiting. The most common places on the body for ticks to attach themselves are the scalp, neck, armpits, waist, and groin. Most tick bites are harmless, but sometimes ticks carry germs that cause diseases. These germs can be spread to a person during the tick's feeding process. The chance of a disease spreading through a tick bite depends on:   The type of tick.  Time of year.   How long the tick is attached.   Geographic location.  HOW CAN YOU PREVENT TICK BITES? Take these steps to help prevent tick bites when you are outdoors:  Wear protective clothing. Long sleeves and long pants are best.   Wear white clothes so you can see ticks more easily.  Tuck your pant legs into your socks.   If walking on a trail, stay in the middle of the trail to  avoid brushing against bushes.  Avoid walking through areas with long grass.  Put insect repellent on all exposed skin and along boot tops, pant legs, and sleeve cuffs.   Check clothing, hair, and skin repeatedly and before going inside.   Brush off any ticks that are not attached.  Take a shower or bath as soon as possible after being outdoors.  WHAT IS THE PROPER WAY TO REMOVE A TICK? Ticks should be removed as soon as possible to help prevent diseases caused by tick bites. 11. If latex gloves are available, put them on before trying to remove a tick.  12. Using fine-point tweezers, grasp the tick as close to the skin as possible. You may also use curved forceps or a tick removal tool. Grasp the tick as close to its head as possible. Avoid grasping the tick on its body. 13. Pull gently with steady upward pressure until the tick lets go. Do not twist the tick or jerk it suddenly. This may break off the tick's head or mouth parts. 14. Do not squeeze or crush the tick's body. This could  force disease-carrying fluids from the tick into your body.  15. After the tick is removed, wash the bite area and your hands with soap and water or other disinfectant such as alcohol. 16. Apply a small amount of antiseptic cream or ointment to the bite site.  17. Wash and disinfect any instruments that were used.  Do not try to remove a tick by applying a hot match, petroleum jelly, or fingernail polish to the tick. These methods do not work and may increase the chances of disease being spread from the tick bite.  WHEN SHOULD YOU SEEK MEDICAL CARE? Contact your health care provider if you are unable to remove a tick from your skin or if a part of the tick breaks off and is stuck in the skin.  After a tick bite, you need to be aware of signs and symptoms that could be related to diseases spread by ticks. Contact your health care provider if you develop any of the following in the days or weeks after the tick bite:  Unexplained fever.  Rash. A circular rash that appears days or weeks after the tick bite may indicate the possibility of Lyme disease. The rash may resemble a target with a bull's-eye and may occur at a different part of your body than the tick bite.  Redness and swelling in the area of the tick bite.   Tender, swollen lymph glands.   Diarrhea.   Weight loss.   Cough.   Fatigue.   Muscle, joint, or bone pain.   Abdominal pain.   Headache.   Lethargy or a change in your level of consciousness.  Difficulty walking or moving your legs.   Numbness in the legs.   Paralysis.  Shortness of breath.   Confusion.   Repeated vomiting.  Document Released: 05/02/2000 Document Revised: 02/23/2013 Document Reviewed: 10/13/2012 [facility] Patient Information 2015 [place], [place]. This information is not intended to replace advice given to you by your health care provider. Make sure you discuss any questions you have with your health care provider.

## 2014-10-22 NOTE — ED Notes (Signed)
Report pulling a tick off the right side of her neck 3 days ago.   C/o rash with pain and irritation right side of neck.  Tried otc antibiotic ointment with no relief.

## 2014-10-22 NOTE — ED Provider Notes (Signed)
CSN: [id number]     Arrival date & time 10/22/14  1918 History   First MD Initiated Contact with Patient 10/22/14 1946     Chief Complaint  Patient presents with  . Insect Bite    tick bite   (Consider location/radiation/quality/duration/timing/severity/associated sxs/prior Treatment) HPI Comments: 54 year old female states that she pulled a tick off the back of her neck 3 days ago. Since that time she has developed a small local itchy red rash in the same area. The location where specifically is the right posterior lateral aspect of the neck. The erythema is slightly thickened and redness elongated along the skin lines of the side of the neck. The length is approximately 4 cm. It is mildly tender. No current drainage. Under magnification there small red and clear papular lesions. No lymphangitis. Full range of motion of the head and neck.  Patient denies any systemic signs. No fever or chills, rash, headache, swelling, fatigue and malaise.   Past Medical History  Diagnosis Date  . Hyperlipidemia   . Mitral valve regurgitation   . Rheumatoid arthritis   . Uterine cancer   . Complication of anesthesia     hard time waking back up   . Family history of adverse reaction to anesthesia     sister also had hard time waking up   . Dysrhythmia   . Heart murmur   . Depression   . Anxiety   . Anginal pain     seen in ER 06/2013 pt states was informed non cardiac stated was ruled to be an esophageal spasm   . History of ear infections     hx of in childhood hx of perorated eardrums bilat   . Cataracts, bilateral    Past Surgical History  Procedure Laterality Date  . Abdominal hysterectomy    . Tonsillectomy      T&A  . Appendectomy    . Eye surgery      lasix twice to left eye and once to right   . Colonscopy       removed polyps (benign)  . Cardiac catheterization      2013  . Lumbar laminectomy/decompression microdiscectomy Right 05/18/2014    Procedure: MICRO LUMBAR DECOMPRESSION  L4 - L5 ON THE RIGHT 1 LEVEL;  Surgeon: Johnn Hai, MD;  Location: [facility];  Service: Orthopedics;  Laterality: Right;   History reviewed. No pertinent family history. History  Substance Use Topics  . Smoking status: Former Smoker -- 2.00 packs/day for 30 years    Types: Cigarettes    Quit date: 11/16/2013  . Smokeless tobacco: Never Used  . Alcohol Use: No   OB History    No data available     Review of Systems  Constitutional: Negative for fever, activity change and fatigue.  HENT: Negative.   Respiratory: Negative.   Cardiovascular: Negative for chest pain.  Gastrointestinal: Negative.   Musculoskeletal: Negative.  Negative for neck pain and neck stiffness.  Skin:       As per history of present illness  Neurological: Negative for dizziness, tremors, syncope, speech difficulty, numbness and headaches.  Hematological: Does not bruise/bleed easily.    Allergies  Dilaudid; Erythromycin; Bee venom; and Valium  Home Medications   Prior to Admission medications   Medication Sig Start Date End Date Taking? Authorizing Provider  atorvastatin (LIPITOR) 40 MG tablet Take 40 mg by mouth daily.   Yes Historical Provider, MD  clonazePAM (KLONOPIN) 1 MG tablet Take 1 mg by mouth  2 (two) times daily.   Yes Historical Provider, MD  sertraline (ZOLOFT) 100 MG tablet Take 100 mg by mouth at bedtime.   Yes Historical Provider, MD  aspirin 81 MG tablet Take 1 tablet (81 mg total) by mouth daily. Resume 4 days post-op 05/18/14   Cecilie Kicks, PA-C  docusate sodium (COLACE) 100 MG capsule Take 1 capsule (100 mg total) by mouth 2 (two) times daily as needed for mild constipation. 05/18/14   Susa Day, MD  folic acid (FOLVITE) 1 MG tablet Take 1 mg by mouth daily.    Historical Provider, MD  ibuprofen (ADVIL,MOTRIN) 800 MG tablet Take 1 tablet (800 mg total) by mouth every 8 (eight) hours as needed. 05/19/14   Olivia Mackie Shuford, PA-C  methocarbamol (ROBAXIN) 500 MG tablet Take 1 tablet  (500 mg total) by mouth every 8 (eight) hours as needed for muscle spasms. 05/18/14   Susa Day, MD  oxyCODONE-acetaminophen (PERCOCET) 5-325 MG per tablet Take 1 tablet by mouth every 4 (four) hours as needed. 05/18/14   Susa Day, MD  predniSONE (DELTASONE) 5 MG tablet Take 5 mg by mouth daily with breakfast.     Historical Provider, MD  traZODone (DESYREL) 150 MG tablet Take 150 mg by mouth at bedtime as needed for sleep.    Historical Provider, MD  triamcinolone cream (KENALOG) 0.1 % Apply 1 application topically 2 (two) times daily. 10/22/14   Janne Napoleon, NP   BP 122/69 mmHg  Pulse 90  Temp(Src) 97.7 F (36.5 C) (Oral)  Resp 12  SpO2 98% Physical Exam  Constitutional: She is oriented to person, place, and time. She appears well-developed and well-nourished. No distress.  HENT:  Head:    Mouth/Throat: Oropharynx is clear and moist.  Eyes: EOM are normal.  Neck: Normal range of motion. Neck supple.  Cardiovascular: Normal rate.   Pulmonary/Chest: Effort normal. No respiratory distress.  Musculoskeletal: Normal range of motion. She exhibits no edema or tenderness.  Lymphadenopathy:    She has no cervical adenopathy.  Neurological: She is alert and oriented to person, place, and time. She exhibits normal muscle tone.  Skin: Skin is warm and dry. No rash noted.  No generalized rash. See history of present illness for description of the small area of redness to the right posterior lateral aspect of the neck.  Psychiatric: She has a normal mood and affect.  Nursing note and vitals reviewed.   ED Course  Procedures (including critical care time) Labs Review Labs Reviewed - No data to display  Imaging Review No results found.   MDM   1. Insect bite of neck with local reaction, initial encounter    Info for RMSF and LYme given. Signs and symptoms discussed in detail with the patient. Cold compresses to the right side of the neck at the side of the bite. Triamcinolone  cream apply twice a day. Watch for any signs of illness such as fever, chills, headache, fatigue, malaise, flulike symptoms, rash or any other signs or symptoms that make you feel bad. Seek medical attention promptly if this occurs.   Janne Napoleon, NP 10/22/14 2050

## 2015-10-01 ENCOUNTER — Ambulatory Visit (INDEPENDENT_AMBULATORY_CARE_PROVIDER_SITE_OTHER): Payer: Managed Care, Other (non HMO) | Admitting: Neurology

## 2015-10-01 ENCOUNTER — Encounter: Payer: Self-pay | Admitting: Neurology

## 2015-10-01 VITALS — BP 122/60 | HR 78 | Resp 16 | Ht 67.0 in | Wt 194.0 lb

## 2015-10-01 DIAGNOSIS — R202 Paresthesia of skin: Secondary | ICD-10-CM | POA: Diagnosis not present

## 2015-10-01 DIAGNOSIS — L539 Erythematous condition, unspecified: Secondary | ICD-10-CM | POA: Diagnosis not present

## 2015-10-01 DIAGNOSIS — R208 Other disturbances of skin sensation: Secondary | ICD-10-CM

## 2015-10-01 MED ORDER — GABAPENTIN 100 MG PO CAPS: ORAL_CAPSULE | ORAL | Status: DC

## 2015-10-01 NOTE — Patient Instructions (Signed)
As discussed, I am not sure that your primary problem causing flare up in the burning sensation in both feet and hands and causing redness in both feet and hands is due to neuropathy. As recommended, I would like to proceed with an EMG and nerve conduction test, please call us when you are ready to schedule. I do not think you need any blood work. You had extensive blood work with your primary care physician as well as your rheumatologist at [facility]. I will go ahead and give you a prescription for a lower dose of gabapentin, 100 mg strength, take 1 pill each night for a week and may increase to up to 1 pill 3 times a day as tolerated. I can see you back on an as-needed basis.

## 2015-10-01 NOTE — Progress Notes (Signed)
Subjective:    Patient ID: Heather Douglas is a 55 y.o. female.  HPI     Star Age, MD, PhD [facility] [address], [address] [place], [place] [address]  Dear Dr. Shelia Media,   I saw your patient, Heather Douglas, upon your kind request in my neurologic clinic today for initial consultation of her paresthesias in her hands and feet. The patient is unaccompanied today. As you know, Heather Douglas is a 55 year old right-handed woman with an underlying medical history of uterine cancer, status post total abdominal hysterectomy, and lymph node removal, followed at [facility], depression, anxiety, rheumatoid arthritis and osteoarthritis, followed by Dr. Annabelle Harman, fibromyalgia, hyperlipidemia, insomnia, prediabetes and chronic low back pain, for which she has seen Dr. Tonita Cong, status post lumbar laminectomy and decompression microdiscectomy at L4-5 on 05/18/2014, who reports burning sensation at the bottom of both feet and to a lesser degree in her fingers, ongoing for the past several weeks, about 6-8 weeks. For the past year, she has noted redish discoloration of her hands. 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 I reviewed your office note from 09/13/2015, which you kindly included. You suggested a trial of gabapentin starting at 300 mg 3 times a day. She had a CT lumbar spine with contrast and myelogram on 12/12/2013:IMPRESSION: 1. Technically successful lumbar puncture for lumbar myelogram. 2. Mild L4-L5 degenerative disc disease and central stenosis associated with disc bulging exacerbated in the standing position. There is mild RIGHT lateral recess encroachment evident on the standing upright images which is not evident on the prone images, potentially affecting the descending RIGHT L5 nerve. 3. L5-S1 mild facet arthrosis without stenosis.  Of note, she has been on methotrexate, probably for the past year. She  used to see Dr. Amil Amen and now sees Dr. Annabelle Harman at [facility] seen at the end of April, was given an oral steroid taper at the time. 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 Reviewed the office note from Dr. Annabelle Harman. Of note, patient had extensive blood work in November 2016 including ANA, rheumatoid arthritis titer, CBC with differential, CMP, rheumatoid factor, anti-CCP, anti-SSB, SSA, ANA, hepatitis panel, vitamin D, CRP, ESR. RA titer was elevated at 40, ANA was positive at 1: 40, CMP unremarkable, rheumatoid factor positive. Other autoantibodies negative. Vitamin D borderline at 31, hepatitis screen negative. CRP normal.  She tried the gabapentin 300 mg strength for about 2 days but it made her too groggy. She did feel that the burning sensation improved but since it comes and goes and is not constant she is not sure that she can take it every day. She quit smoking 2 years ago, uses vapor cigarettes now. She does not drink alcohol or use illicit drugs, she drinks soda about 24 ounces per day. She lives with her husband. She is a Marine scientist. 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 She has 1 child. Looking back, even as a child she was sensitive in her hands and feet and nose. She was sensitive to cold temperatures.  Her Past Medical History Is Significant For: Past Medical History  Diagnosis Date  . Hyperlipidemia   . Mitral valve regurgitation   . Rheumatoid arthritis ([place])   . Uterine cancer ([place])   . Complication of anesthesia     hard time waking back up   . Family history of adverse  reaction to anesthesia     sister also had hard time waking up   . Dysrhythmia   . Heart murmur   . Depression   . Anxiety   . Anginal pain ([place])     seen in ER 06/2013 pt states was informed non cardiac stated was ruled to be an esophageal spasm   . History of ear infections     hx of in childhood hx of perorated eardrums  bilat   . Cataracts, bilateral   . Fibromyalgia     Her Past Surgical History Is Significant For: Past Surgical History  Procedure Laterality Date  . Abdominal hysterectomy    . Tonsillectomy      T&A  . Appendectomy    . Eye surgery      lasix twice to left eye and once to right   . Colonscopy       removed polyps (benign)  . Cardiac catheterization      2013  . Lumbar laminectomy/decompression microdiscectomy Right 05/18/2014    Procedure: MICRO LUMBAR DECOMPRESSION L4 - L5 ON THE RIGHT 1 LEVEL;  Surgeon: Johnn Hai, MD;  Location: [facility];  Service: Orthopedics;  Laterality: Right;  . Laminectomy      Her Family History Is Significant For: Family History  Problem Relation Age of Onset  . Diabetes Mother   . Hypertension Mother   . Hyperlipidemia Mother   . Arthritis Mother   . Arthritis Paternal Grandfather     Her Social History Is Significant For: Social History   Social History  . Marital Status: Married    Spouse Name: N/A  . Number of Children: 1  . Years of Education: Assoc   Occupational History  . N/A    Social History Main Topics  . Smoking status: Former Smoker -- 2.00 packs/day for 30 years    Types: Cigarettes    Quit date: 11/16/2013  . Smokeless tobacco: Never Used  . Alcohol Use: No  . Drug Use: No  . Sexual Activity: Yes    Birth Control/ Protection: Surgical   Other Topics Concern  . None   Social History Narrative   Drinks 24oz of coke a day     Her Allergies Are:  Allergies  Allergen Reactions  . Dilaudid [Hydromorphone Hcl] Itching  . Erythromycin Other (See Comments)    "Severe GI distress"  . Bee Venom   . Valium [Diazepam] Other (See Comments)    "makes me nutty"  :   Her Current Medications Are:  Outpatient Encounter Prescriptions as of 10/01/2015  Medication Sig  . aspirin 81 MG tablet Take 1 tablet (81 mg total) by mouth daily. Resume 4 days post-op  . atorvastatin (LIPITOR) 40 MG tablet Take 40 mg by mouth  daily.  . clonazePAM (KLONOPIN) 1 MG tablet Take 1 mg by mouth 2 (two) times daily.  . folic acid (FOLVITE) 1 MG tablet Take 1 mg by mouth daily.  Marland Kitchen ibuprofen (ADVIL,MOTRIN) 800 MG tablet Take 1 tablet (800 mg total) by mouth every 8 (eight) hours as needed.  . methotrexate (RHEUMATREX) 2.5 MG tablet   . sertraline (ZOLOFT) 100 MG tablet Take 100 mg by mouth at bedtime.  . traZODone (DESYREL) 150 MG tablet Take 150 mg by mouth at bedtime as needed for sleep.  Marland Kitchen gabapentin (NEURONTIN) 100 MG capsule Take 1 pill nightly at bedtime for 1 week, then 2 pills nightly for 1 week, then 3 pills each night thereafter.  . [DISCONTINUED]  docusate sodium (COLACE) 100 MG capsule Take 1 capsule (100 mg total) by mouth 2 (two) times daily as needed for mild constipation.  . [DISCONTINUED] methocarbamol (ROBAXIN) 500 MG tablet Take 1 tablet (500 mg total) by mouth every 8 (eight) hours as needed for muscle spasms.  . [DISCONTINUED] oxyCODONE-acetaminophen (PERCOCET) 5-325 MG per tablet Take 1 tablet by mouth every 4 (four) hours as needed.  . [DISCONTINUED] predniSONE (DELTASONE) 5 MG tablet Take 5 mg by mouth daily with breakfast.   . [DISCONTINUED] triamcinolone cream (KENALOG) 0.1 % Apply 1 application topically 2 (two) times daily.   No facility-administered encounter medications on file as of 10/01/2015.  :   Review of Systems:  Out of a complete 14 point review of systems, all are reviewed and negative with the exception of these symptoms as listed below:   Review of Systems  Neurological:       Patient reports that her hands have been red for past year. Has had burning sensation in hands and feet for a couple of months now.     Objective:  Neurologic Exam  Physical Exam Physical Examination:   Filed Vitals:   10/01/15 1435  BP: 122/60  Pulse: 78  Resp: 16   General Examination: The patient is a very pleasant 55 y.o. female in no acute distress. She appears well-developed and well-nourished  and well groomed.   HEENT: Normocephalic, atraumatic, pupils are equal, round and reactive to light and accommodation. Funduscopic exam is normal with sharp disc margins noted. Extraocular tracking is good without limitation to gaze excursion or nystagmus noted. Normal smooth pursuit is noted. Hearing is grossly intact. Tympanic membranes are clear bilaterally. Face is symmetric with normal facial animation and normal facial sensation. Speech is clear with no dysarthria noted. There is no hypophonia. There is no lip, neck/head, jaw or voice tremor. Neck is supple with full range of passive and active motion. There are no carotid bruits on auscultation. Oropharynx exam reveals: moderate mouth dryness, good dental hygiene and mild airway crowding. Tongue central.    Chest: Clear to auscultation without wheezing, rhonchi or crackles noted.  Heart: S1+S2+0, regular and normal without murmurs, rubs or gallops noted.   Abdomen: Soft, non-tender and non-distended with normal bowel sounds appreciated on auscultation.  Extremities: There is no pitting edema in the distal lower extremities bilaterally. Pedal pulses are intact.  Skin: Warm and dry without trophic changes noted. There are no varicose veins. She has mild erythema on the palmar aspect of both hands, no significant redness seen in her feet.  Musculoskeletal: exam reveals no obvious joint deformities, tenderness or joint swelling or erythema.   Neurologically:  Mental status: The patient is awake, alert and oriented in all 4 spheres. Her immediate and remote memory, attention, language skills and fund of knowledge are appropriate. There is no evidence of aphasia, agnosia, apraxia or anomia. Speech is clear with normal prosody and enunciation. Thought process is linear. Mood is normal and affect is normal.  Cranial nerves II - XII are as described above under HEENT exam. In addition: shoulder shrug is normal with equal shoulder height noted. Motor  exam: Normal bulk, strength and tone is noted. There is no drift, tremor or rebound. Romberg is negative. Reflexes are 2+ throughout. Babinski: Toes are flexor bilaterally. Fine motor skills and coordination: intact with normal finger taps, normal hand movements, normal rapid alternating patting, normal foot taps and normal foot agility.  Cerebellar testing: No dysmetria or intention tremor on finger  to nose testing. Heel to shin is slightly difficult for her, she reports hip pain bilaterally, left more than right. There is no truncal or gait ataxia.  Sensory exam: intact to light touch, pinprick, vibration, temperature sense the upper and lower extremities.  Gait, station and balance: She stands with mild difficulty. No veering to one side is noted. No leaning to one side is noted. Posture is age-appropriate and stance is narrow based. Gait shows normal stride length and normal pace. No problems turning are noted. Tandem walk is slightly challenging for her, but doable. Assessment and Plan:    In summary, Heather Douglas is a very pleasant 55 y.o.-year old female with an underlying medical history of uterine cancer, status post total abdominal hysterectomy, and lymph node removal, followed at [facility], depression, anxiety, rheumatoid arthritis and osteoarthritis, followed by Dr. Annabelle Harman, fibromyalgia, hyperlipidemia, insomnia, prediabetes and chronic low back pain, for which she has seen Dr. Tonita Cong, status post lumbar laminectomy and decompression microdiscectomy at L4-5 on 05/18/2014, who reports burning sensation at the bottom of both feet and to a lesser degree in her fingers, ongoing for the past several weeks, about 6-8 weeks, intermittent,symptoms are not constant. She reports that it helps to lay down, any particular time of day is not worse than others. No other triggers are reported. Her exam is reassuringly nonfocal. She is advised that it is not impossible for her to have mild neuropathy or beginning  of neuropathy but the episodic nature makes it less likely and the redness and heat sensation is not readily explained by neuropathy. She is on methotrexate which can cause in rare instances neuropathy. I suggested we proceed with EMG and nerve conduction testing, but she would like to hold off. She had extensive blood work and I did not suggest any additional blood tests at this time. Symptomatically, she felt some relief with gabapentin but was too sedated by the 300 mg strength. I suggested that she will lower the dose to 100 mg, I provided her with a prescription to bridge her. She is encouraged to call our office when she is ready to schedule her EMG and nerve conduction test. I explained the test to her in detail. She is worried about the cost and the painful nature of the test and I tried to reassure her but ultimately, she is going to call her when she is ready to schedule. She will follow-up with you regarding the gabapentin. I answered all their questions today and the patient and her husband were in agreement.  Thank you very much for allowing me to participate in the care of this nice patient. If I can be of any further assistance to you please do not hesitate to call me at [phone].  Sincerely,   Star Age, MD, PhD

## 2015-10-08 ENCOUNTER — Ambulatory Visit: Payer: Self-pay | Admitting: Neurology

## 2016-11-24 ENCOUNTER — Telehealth: Payer: Self-pay

## 2016-11-26 NOTE — Telephone Encounter (Signed)
LMTC x 1  

## 2016-12-02 NOTE — Telephone Encounter (Signed)
LMTC  X 1  

## 2016-12-08 NOTE — Telephone Encounter (Signed)
[place] x 1 ( Will close message if pt does not call back after this message per protocol)

## 2016-12-11 NOTE — Telephone Encounter (Signed)
Due to multiple attempts to contact pt message will be closed.

## 2017-04-06 ENCOUNTER — Other Ambulatory Visit: Payer: Self-pay | Admitting: Acute Care

## 2017-04-06 DIAGNOSIS — Z87891 Personal history of nicotine dependence: Secondary | ICD-10-CM

## 2017-04-06 DIAGNOSIS — Z122 Encounter for screening for malignant neoplasm of respiratory organs: Secondary | ICD-10-CM

## 2017-04-13 ENCOUNTER — Ambulatory Visit (INDEPENDENT_AMBULATORY_CARE_PROVIDER_SITE_OTHER)
Admission: RE | Admit: 2017-04-13 | Discharge: 2017-04-13 | Disposition: A | Discharge status: Home / Self Care | Payer: Managed Care, Other (non HMO) | Source: Ambulatory Visit | Attending: Acute Care | Admitting: Acute Care

## 2017-04-13 ENCOUNTER — Ambulatory Visit (INDEPENDENT_AMBULATORY_CARE_PROVIDER_SITE_OTHER): Payer: Managed Care, Other (non HMO) | Admitting: Acute Care

## 2017-04-13 ENCOUNTER — Encounter: Payer: Self-pay | Admitting: Acute Care

## 2017-04-13 ENCOUNTER — Encounter: Payer: Managed Care, Other (non HMO) | Admitting: Acute Care

## 2017-04-13 ENCOUNTER — Inpatient Hospital Stay: Admission: RE | Admit: 2017-04-13 | Payer: Self-pay | Source: Ambulatory Visit

## 2017-04-13 DIAGNOSIS — Z122 Encounter for screening for malignant neoplasm of respiratory organs: Secondary | ICD-10-CM

## 2017-04-13 DIAGNOSIS — Z87891 Personal history of nicotine dependence: Secondary | ICD-10-CM

## 2017-04-13 NOTE — Progress Notes (Signed)
Shared Decision Making Visit Lung Cancer Screening Program [phone])   Eligibility:  Age 56 y.o.  Pack Years Smoking History Calculation 68 pack year smoking history (# packs/per year x # years smoked)  Recent History of coughing up blood  no  Unexplained weight loss? no ( >Than 15 pounds within the last 6 months )  Prior History Lung / other cancer no (Diagnosis within the last 5 years already requiring surveillance chest CT Scans).  Smoking Status Former Smoker  Former Smokers: Years since quit: 5 years  Quit Date: 2013  Visit Components:  Discussion included one or more decision making aids. yes  Discussion included risk/benefits of screening. yes  Discussion included potential follow up diagnostic testing for abnormal scans. yes  Discussion included meaning and risk of over diagnosis. yes  Discussion included meaning and risk of False Positives. yes  Discussion included meaning of total radiation exposure. yes  Counseling Included:  Importance of adherence to annual lung cancer LDCT screening. yes  Impact of comorbidities on ability to participate in the program. yes  Ability and willingness to under diagnostic treatment. yes  Smoking Cessation Counseling:  Current Smokers:   Discussed importance of smoking cessation. NA  Information about tobacco cessation classes and interventions provided to patient. yes  Patient provided with "ticket" for LDCT Scan. yes  Symptomatic Patient. no  Counseling  Diagnosis Code: Tobacco Use Z72.0  Asymptomatic Patient yes  Counseling (Intermediate counseling: > three minutes counseling) Z6010  Former Smokers:   Discussed the importance of maintaining cigarette abstinence. yes  Diagnosis Code: Personal History of Nicotine Dependence. X32.355  Information about tobacco cessation classes and interventions provided to patient. Yes  Patient provided with "ticket" for LDCT Scan. yes  Written Order for Lung Cancer  Screening with LDCT placed in Epic. Yes (CT Chest Lung Cancer Screening Low Dose W/O CM) [id number] Z12.2-Screening of respiratory organs Z87.891-Personal history of nicotine dependence  I spent 25 minutes of face to face time with Ms. Vantuyl  discussing the risks and benefits of lung cancer screening. We viewed a power point together that explained in detail the above noted topics. We took the time to pause the power point at intervals to allow for questions to be asked and answered to ensure understanding. We discussed that she had taken the single most powerful action possible to decrease her risk of developing lung cancer when she quit smoking. I counseled her to remain smoke free, and to contact me if she ever had the desire to smoke again so that I can provide resources and tools to help support the effort to remain smoke free. We discussed the time and location of the scan, and that either  Doroteo Glassman RN or I will call with the results within  24-48 hours of receiving them. She  has my card and contact information in the event she  needs to speak with me, in addition to a copy of the power point we reviewed as a resource. She has  verbalized understanding of all of the above and had no further questions upon leaving the office.     I explained to the patient that there has been a high incidence of coronary artery disease noted on these exams. I explained that this is a non-gated exam therefore degree or severity cannot be determined. This patient is currently on statin therapy. I have asked the patient to follow-up with their PCP regarding any incidental finding of coronary artery disease and management with diet or medication  as they feel is clinically indicated. The patient verbalized understanding of the above and had no further questions.      Magdalen Spatz, NP  04/13/2017

## 2017-04-14 ENCOUNTER — Telehealth: Payer: Self-pay | Admitting: Acute Care

## 2017-04-14 DIAGNOSIS — Z87891 Personal history of nicotine dependence: Secondary | ICD-10-CM

## 2017-04-14 DIAGNOSIS — Z122 Encounter for screening for malignant neoplasm of respiratory organs: Secondary | ICD-10-CM

## 2017-04-15 NOTE — Telephone Encounter (Signed)
Pt informed of CT results per Sarah Groce, NP.  PT verbalized understanding.  Copy sent to PCP.  Order placed for 1 yr f/u CT.  

## 2017-04-15 NOTE — Telephone Encounter (Signed)
ATC- Unable to leave message- Will call back °

## 2018-04-14 ENCOUNTER — Inpatient Hospital Stay: Admission: RE | Admit: 2018-04-14 | Payer: Managed Care, Other (non HMO) | Source: Ambulatory Visit

## 2018-04-21 ENCOUNTER — Telehealth: Payer: Self-pay | Admitting: Acute Care

## 2018-04-22 NOTE — Telephone Encounter (Signed)
Heather Douglas can you call this pt to schedule f/u LDCT?

## 2018-04-26 NOTE — Telephone Encounter (Signed)
I have tried calling the patient to reschedule this CT and so has Erline Levine at [facility]. I lvm for patient to return my call

## 2018-04-26 NOTE — Telephone Encounter (Signed)
Heather Douglas, has this be taken care of? Checking to see if encounter can be closed or if further action is needed?  Will route to [employer]

## 2018-04-28 NOTE — Telephone Encounter (Signed)
Heather Douglas, please advise once you have heard from the patient, thank you.

## 2018-04-30 NOTE — Telephone Encounter (Signed)
Attempted to contact patient, someone picked up and hung up phone. Attempted to call back and got the voicemail.

## 2018-05-03 NOTE — Telephone Encounter (Signed)
PT is scheduled for LDCT 05/06/18 at 2:45.  Nothing further needed.

## 2018-05-06 ENCOUNTER — Ambulatory Visit (INDEPENDENT_AMBULATORY_CARE_PROVIDER_SITE_OTHER)
Admission: RE | Admit: 2018-05-06 | Discharge: 2018-05-06 | Disposition: A | Discharge status: Home / Self Care | Payer: Managed Care, Other (non HMO) | Source: Ambulatory Visit | Attending: Acute Care | Admitting: Acute Care

## 2018-05-06 DIAGNOSIS — Z87891 Personal history of nicotine dependence: Secondary | ICD-10-CM | POA: Diagnosis not present

## 2018-05-06 DIAGNOSIS — Z122 Encounter for screening for malignant neoplasm of respiratory organs: Secondary | ICD-10-CM

## 2018-05-10 ENCOUNTER — Telehealth: Payer: Self-pay | Admitting: Acute Care

## 2018-05-10 DIAGNOSIS — Z122 Encounter for screening for malignant neoplasm of respiratory organs: Secondary | ICD-10-CM

## 2018-05-10 DIAGNOSIS — Z87891 Personal history of nicotine dependence: Secondary | ICD-10-CM

## 2018-05-10 NOTE — Telephone Encounter (Signed)
LMTC x 1 to give CT results 

## 2018-05-18 NOTE — Telephone Encounter (Signed)
LMTC x 1  

## 2018-05-21 ENCOUNTER — Encounter: Payer: Self-pay | Admitting: *Deleted

## 2018-05-21 NOTE — Telephone Encounter (Signed)
Letter mailed to pt with  CT results per Sarah Groce, NP.    Copy sent to PCP.  Order placed for 1 yr f/u CT. ° °

## 2018-08-20 DIAGNOSIS — M797 Fibromyalgia: Secondary | ICD-10-CM | POA: Diagnosis not present

## 2018-08-20 DIAGNOSIS — M1990 Unspecified osteoarthritis, unspecified site: Secondary | ICD-10-CM | POA: Diagnosis not present

## 2018-08-20 DIAGNOSIS — M25569 Pain in unspecified knee: Secondary | ICD-10-CM | POA: Diagnosis not present

## 2018-08-20 DIAGNOSIS — M0589 Other rheumatoid arthritis with rheumatoid factor of multiple sites: Secondary | ICD-10-CM | POA: Diagnosis not present

## 2018-10-14 DIAGNOSIS — F3342 Major depressive disorder, recurrent, in full remission: Secondary | ICD-10-CM | POA: Diagnosis not present

## 2018-10-14 DIAGNOSIS — F410 Panic disorder [episodic paroxysmal anxiety] without agoraphobia: Secondary | ICD-10-CM | POA: Diagnosis not present

## 2018-11-16 DIAGNOSIS — J019 Acute sinusitis, unspecified: Secondary | ICD-10-CM | POA: Diagnosis not present

## 2018-11-16 DIAGNOSIS — R062 Wheezing: Secondary | ICD-10-CM | POA: Diagnosis not present

## 2019-04-07 DIAGNOSIS — F3342 Major depressive disorder, recurrent, in full remission: Secondary | ICD-10-CM | POA: Diagnosis not present

## 2019-04-07 DIAGNOSIS — F410 Panic disorder [episodic paroxysmal anxiety] without agoraphobia: Secondary | ICD-10-CM | POA: Diagnosis not present

## 2019-05-25 ENCOUNTER — Inpatient Hospital Stay: Admission: RE | Admit: 2019-05-25 | Payer: Managed Care, Other (non HMO) | Source: Ambulatory Visit

## 2019-06-08 DIAGNOSIS — M25561 Pain in right knee: Secondary | ICD-10-CM | POA: Diagnosis not present

## 2019-06-08 DIAGNOSIS — M7062 Trochanteric bursitis, left hip: Secondary | ICD-10-CM | POA: Diagnosis not present

## 2019-06-08 DIAGNOSIS — M2550 Pain in unspecified joint: Secondary | ICD-10-CM | POA: Diagnosis not present

## 2019-06-08 DIAGNOSIS — G8929 Other chronic pain: Secondary | ICD-10-CM | POA: Diagnosis not present

## 2019-06-08 DIAGNOSIS — M170 Bilateral primary osteoarthritis of knee: Secondary | ICD-10-CM | POA: Diagnosis not present

## 2019-06-08 DIAGNOSIS — M7061 Trochanteric bursitis, right hip: Secondary | ICD-10-CM | POA: Diagnosis not present

## 2019-06-08 DIAGNOSIS — H04123 Dry eye syndrome of bilateral lacrimal glands: Secondary | ICD-10-CM | POA: Diagnosis not present

## 2019-06-08 DIAGNOSIS — M545 Low back pain: Secondary | ICD-10-CM | POA: Diagnosis not present

## 2019-06-08 DIAGNOSIS — M25562 Pain in left knee: Secondary | ICD-10-CM | POA: Diagnosis not present

## 2019-06-08 DIAGNOSIS — R682 Dry mouth, unspecified: Secondary | ICD-10-CM | POA: Diagnosis not present

## 2019-06-17 ENCOUNTER — Encounter: Payer: Self-pay | Admitting: Acute Care

## 2019-07-05 DIAGNOSIS — H2513 Age-related nuclear cataract, bilateral: Secondary | ICD-10-CM | POA: Diagnosis not present

## 2019-07-05 DIAGNOSIS — H04129 Dry eye syndrome of unspecified lacrimal gland: Secondary | ICD-10-CM | POA: Diagnosis not present

## 2019-07-05 DIAGNOSIS — Z79899 Other long term (current) drug therapy: Secondary | ICD-10-CM | POA: Diagnosis not present

## 2019-07-05 DIAGNOSIS — M3501 Sicca syndrome with keratoconjunctivitis: Secondary | ICD-10-CM | POA: Diagnosis not present

## 2019-07-25 DIAGNOSIS — Z1283 Encounter for screening for malignant neoplasm of skin: Secondary | ICD-10-CM | POA: Diagnosis not present

## 2019-07-25 DIAGNOSIS — L821 Other seborrheic keratosis: Secondary | ICD-10-CM | POA: Diagnosis not present

## 2019-07-25 DIAGNOSIS — D485 Neoplasm of uncertain behavior of skin: Secondary | ICD-10-CM | POA: Diagnosis not present

## 2019-07-25 DIAGNOSIS — D225 Melanocytic nevi of trunk: Secondary | ICD-10-CM | POA: Diagnosis not present

## 2019-08-15 DIAGNOSIS — M3501 Sicca syndrome with keratoconjunctivitis: Secondary | ICD-10-CM | POA: Diagnosis not present

## 2019-08-15 DIAGNOSIS — H04129 Dry eye syndrome of unspecified lacrimal gland: Secondary | ICD-10-CM | POA: Diagnosis not present

## 2019-08-15 DIAGNOSIS — Z79899 Other long term (current) drug therapy: Secondary | ICD-10-CM | POA: Diagnosis not present

## 2019-08-15 DIAGNOSIS — H02889 Meibomian gland dysfunction of unspecified eye, unspecified eyelid: Secondary | ICD-10-CM | POA: Diagnosis not present

## 2019-09-08 DIAGNOSIS — R197 Diarrhea, unspecified: Secondary | ICD-10-CM | POA: Diagnosis not present

## 2019-09-08 DIAGNOSIS — Z20828 Contact with and (suspected) exposure to other viral communicable diseases: Secondary | ICD-10-CM | POA: Diagnosis not present

## 2019-09-08 DIAGNOSIS — B342 Coronavirus infection, unspecified: Secondary | ICD-10-CM | POA: Diagnosis not present

## 2019-09-08 DIAGNOSIS — R05 Cough: Secondary | ICD-10-CM | POA: Diagnosis not present

## 2019-10-26 DIAGNOSIS — F3342 Major depressive disorder, recurrent, in full remission: Secondary | ICD-10-CM | POA: Diagnosis not present

## 2019-10-26 DIAGNOSIS — F410 Panic disorder [episodic paroxysmal anxiety] without agoraphobia: Secondary | ICD-10-CM | POA: Diagnosis not present

## 2019-11-23 DIAGNOSIS — Z1231 Encounter for screening mammogram for malignant neoplasm of breast: Secondary | ICD-10-CM | POA: Diagnosis not present

## 2020-02-01 DIAGNOSIS — Z79899 Other long term (current) drug therapy: Secondary | ICD-10-CM | POA: Diagnosis not present

## 2020-02-01 DIAGNOSIS — M1990 Unspecified osteoarthritis, unspecified site: Secondary | ICD-10-CM | POA: Diagnosis not present

## 2020-02-01 DIAGNOSIS — M797 Fibromyalgia: Secondary | ICD-10-CM | POA: Diagnosis not present

## 2020-02-01 DIAGNOSIS — E559 Vitamin D deficiency, unspecified: Secondary | ICD-10-CM | POA: Diagnosis not present

## 2020-02-01 DIAGNOSIS — M0589 Other rheumatoid arthritis with rheumatoid factor of multiple sites: Secondary | ICD-10-CM | POA: Diagnosis not present

## 2020-02-01 DIAGNOSIS — M25569 Pain in unspecified knee: Secondary | ICD-10-CM | POA: Diagnosis not present

## 2020-03-01 DIAGNOSIS — E041 Nontoxic single thyroid nodule: Secondary | ICD-10-CM | POA: Diagnosis not present

## 2020-03-05 ENCOUNTER — Other Ambulatory Visit: Payer: Self-pay | Admitting: Physician Assistant

## 2020-03-05 DIAGNOSIS — E041 Nontoxic single thyroid nodule: Secondary | ICD-10-CM

## 2020-03-12 DIAGNOSIS — M1712 Unilateral primary osteoarthritis, left knee: Secondary | ICD-10-CM | POA: Diagnosis not present

## 2020-03-13 ENCOUNTER — Ambulatory Visit
Admission: RE | Admit: 2020-03-13 | Discharge: 2020-03-13 | Disposition: A | Discharge status: Home / Self Care | Payer: BC Managed Care – PPO | Source: Ambulatory Visit | Attending: Physician Assistant | Admitting: Physician Assistant

## 2020-03-13 DIAGNOSIS — E041 Nontoxic single thyroid nodule: Secondary | ICD-10-CM

## 2020-03-28 ENCOUNTER — Other Ambulatory Visit: Payer: Self-pay | Admitting: *Deleted

## 2020-03-28 DIAGNOSIS — Z87891 Personal history of nicotine dependence: Secondary | ICD-10-CM

## 2020-04-06 DIAGNOSIS — E041 Nontoxic single thyroid nodule: Secondary | ICD-10-CM | POA: Diagnosis not present

## 2020-04-18 ENCOUNTER — Inpatient Hospital Stay: Admission: RE | Admit: 2020-04-18 | Payer: BC Managed Care – PPO | Source: Ambulatory Visit

## 2020-04-30 ENCOUNTER — Other Ambulatory Visit: Payer: Self-pay | Admitting: Otolaryngology

## 2020-04-30 ENCOUNTER — Other Ambulatory Visit: Payer: Self-pay

## 2020-04-30 ENCOUNTER — Ambulatory Visit (INDEPENDENT_AMBULATORY_CARE_PROVIDER_SITE_OTHER)
Admission: RE | Admit: 2020-04-30 | Discharge: 2020-04-30 | Disposition: A | Discharge status: Home / Self Care | Payer: BC Managed Care – PPO | Source: Ambulatory Visit | Attending: Acute Care | Admitting: Acute Care

## 2020-04-30 DIAGNOSIS — E041 Nontoxic single thyroid nodule: Secondary | ICD-10-CM

## 2020-04-30 DIAGNOSIS — Z87891 Personal history of nicotine dependence: Secondary | ICD-10-CM | POA: Diagnosis not present

## 2020-05-01 ENCOUNTER — Inpatient Hospital Stay: Admission: RE | Admit: 2020-05-01 | Payer: BC Managed Care – PPO | Source: Ambulatory Visit

## 2020-05-04 NOTE — Progress Notes (Signed)
Please call patient and let them  know their  low dose Ct was read as a Lung RADS 2: nodules that are benign in appearance and behavior with a very low likelihood of becoming a clinically active cancer due to size or lack of growth. Recommendation per radiology is for a repeat LDCT in 12 months. .Please let them  know we will order and schedule their  annual screening scan for 04/2021. Please let them  know there was notation of CAD on their  scan.  Please remind the patient  that this is a non-gated exam therefore degree or severity of disease  cannot be determined. Please have them  follow up with their PCP regarding potential risk factor modification, dietary therapy or pharmacologic therapy if clinically indicated. Pt.  is  currently on statin therapy. Please place order for annual  screening scan for  04/2021 and fax results to PCP. Thanks so much.  Please let patient know there was notation of Hepatic steatosis. This  is a term that describes the build up of fat in the liver. It is normal to have small amounts of fat in your liver, but when the proportion of liver cells that contain fat exceeds more than 5% it is indicative of early stage fatty liver.Treatment often involves reducing risk factors through a diet and exercise plan. It is generally a benign condition, but in a small percentage of patients it does require follow up. Please have the patient follow up with PCP regarding potential risk factor modification, dietary therapy or pharmacologic therapy if clinically indicated.

## 2020-05-08 DIAGNOSIS — M25551 Pain in right hip: Secondary | ICD-10-CM | POA: Diagnosis not present

## 2020-05-08 DIAGNOSIS — M1712 Unilateral primary osteoarthritis, left knee: Secondary | ICD-10-CM | POA: Diagnosis not present

## 2020-05-08 DIAGNOSIS — M170 Bilateral primary osteoarthritis of knee: Secondary | ICD-10-CM | POA: Diagnosis not present

## 2020-05-08 DIAGNOSIS — M25552 Pain in left hip: Secondary | ICD-10-CM | POA: Diagnosis not present

## 2020-05-09 ENCOUNTER — Other Ambulatory Visit: Payer: Self-pay | Admitting: *Deleted

## 2020-05-09 DIAGNOSIS — Z87891 Personal history of nicotine dependence: Secondary | ICD-10-CM

## 2020-05-09 DIAGNOSIS — F331 Major depressive disorder, recurrent, moderate: Secondary | ICD-10-CM | POA: Diagnosis not present

## 2020-05-09 DIAGNOSIS — F411 Generalized anxiety disorder: Secondary | ICD-10-CM | POA: Diagnosis not present

## 2020-05-15 DIAGNOSIS — J432 Centrilobular emphysema: Secondary | ICD-10-CM | POA: Diagnosis not present

## 2020-05-15 DIAGNOSIS — I2510 Atherosclerotic heart disease of native coronary artery without angina pectoris: Secondary | ICD-10-CM | POA: Diagnosis not present

## 2020-05-15 DIAGNOSIS — I2584 Coronary atherosclerosis due to calcified coronary lesion: Secondary | ICD-10-CM | POA: Diagnosis not present

## 2020-05-15 DIAGNOSIS — E041 Nontoxic single thyroid nodule: Secondary | ICD-10-CM | POA: Diagnosis not present

## 2020-05-15 DIAGNOSIS — E559 Vitamin D deficiency, unspecified: Secondary | ICD-10-CM | POA: Diagnosis not present

## 2020-05-27 NOTE — Progress Notes (Deleted)
Cardiology Office Note:   Date:  05/27/2020  NAME:  Heather Douglas    MRN: [id number] DOB:  [birth year]   PCP:  Deland Pretty, MD  Cardiologist:  No primary care provider on file.  Electrophysiologist:  None   Referring MD: Deland Pretty, MD   No chief complaint on file. ***  History of Present Illness:   Heather Douglas is a 60 y.o. female with a hx of tobacco abuse, anxiety, HTN who is being seen today for the evaluation of coronary calcifications on CT lung CA screening at the request of Deland Pretty, MD. Recent lung CA screening and sent for evaluation. Mild to moderate LAD calcifications on my review.   Past Medical History: Past Medical History:  Diagnosis Date  . Anginal pain ([place])    seen in ER 06/2013 pt states was informed non cardiac stated was ruled to be an esophageal spasm   . Anxiety   . Cataracts, bilateral   . Complication of anesthesia    hard time waking back up   . Depression   . Dysrhythmia   . Family history of adverse reaction to anesthesia    sister also had hard time waking up   . Fibromyalgia   . Heart murmur   . History of ear infections    hx of in childhood hx of perorated eardrums bilat   . Hyperlipidemia   . Mitral valve regurgitation   . Rheumatoid arthritis ([place])   . Uterine cancer [facility])     Past Surgical History: Past Surgical History:  Procedure Laterality Date  . ABDOMINAL HYSTERECTOMY    . APPENDECTOMY    . CARDIAC CATHETERIZATION     2013  . colonscopy      removed polyps (benign)  . EYE SURGERY     lasix twice to left eye and once to right   . LAMINECTOMY    . LUMBAR LAMINECTOMY/DECOMPRESSION MICRODISCECTOMY Right 05/18/2014   Procedure: MICRO LUMBAR DECOMPRESSION L4 - L5 ON THE RIGHT 1 LEVEL;  Surgeon: Johnn Hai, MD;  Location: [facility];  Service: Orthopedics;  Laterality: Right;  . TONSILLECTOMY     T&A    Current Medications: No outpatient medications have been marked as taking for the 05/30/20 encounter (Appointment)  with O'Neal, Cassie Freer, MD.     Allergies:    Dilaudid [hydromorphone hcl], Erythromycin, Bee venom, and Valium [diazepam]   Social History: Social History   Socioeconomic History  . Marital status: Married    Spouse name: Not on file  . Number of children: 1  . Years of education: Assoc  . Highest education level: Not on file  Occupational History  . Occupation: N/A  Tobacco Use  . Smoking status: Former Smoker    Packs/day: 2.00    Years: 34.00    Pack years: 68.00    Types: Cigarettes    Quit date: 11/16/2013    Years since quitting: 6.5  . Smokeless tobacco: Never Used  . Tobacco comment: Continues to vape. Counseled to quit  Substance and Sexual Activity  . Alcohol use: No  . Drug use: No  . Sexual activity: Yes    Birth control/protection: Surgical  Other Topics Concern  . Not on file  Social History Narrative   Drinks 24oz of coke a day    Social Determinants of Health   Financial Resource Strain: Not on file  Food Insecurity: Not on file  Transportation Needs: Not on file  Physical Activity: Not on file  Stress:  Not on file  Social Connections: Not on file     Family History: The patient's ***family history includes Arthritis in her mother and paternal grandfather; Diabetes in her mother; Hyperlipidemia in her mother; Hypertension in her mother.  ROS:   All other ROS reviewed and negative. Pertinent positives noted in the HPI.     EKGs/Labs/Other Studies Reviewed:   The following studies were personally reviewed by me today:  EKG:  EKG is *** ordered today.  The ekg ordered today demonstrates ***, and was personally reviewed by me.   Recent Labs: No results found for requested labs within last 8760 hours.   Recent Lipid Panel No results found for: CHOL, TRIG, HDL, CHOLHDL, VLDL, LDLCALC, LDLDIRECT  Physical Exam:   VS:  There were no vitals taken for this visit.   Wt Readings from Last 3 Encounters:  10/01/15 194 lb (88 kg)  05/18/14 201  lb (91.2 kg)  05/09/14 201 lb 4 oz (91.3 kg)    General: Well nourished, well developed, in no acute distress Head: Atraumatic, normal size  Eyes: PEERLA, EOMI  Neck: Supple, no JVD Endocrine: No thryomegaly Cardiac: Normal S1, S2; RRR; no murmurs, rubs, or gallops Lungs: Clear to auscultation bilaterally, no wheezing, rhonchi or rales  Abd: Soft, nontender, no hepatomegaly  Ext: No edema, pulses 2+ Musculoskeletal: No deformities, BUE and BLE strength normal and equal Skin: Warm and dry, no rashes   Neuro: Alert and oriented to person, place, time, and situation, CNII-XII grossly intact, no focal deficits  Psych: Normal mood and affect   ASSESSMENT:   Heather Douglas is a 60 y.o. female who presents for the following: No diagnosis found.  PLAN:   There are no diagnoses linked to this encounter.  Disposition: No follow-ups on file.  Medication Adjustments/Labs and Tests Ordered: Current medicines are reviewed at length with the patient today.  Concerns regarding medicines are outlined above.  No orders of the defined types were placed in this encounter.  No orders of the defined types were placed in this encounter.   There are no Patient Instructions on file for this visit.   Time Spent with Patient: I have spent a total of *** minutes with patient reviewing hospital notes, telemetry, EKGs, labs and examining the patient as well as establishing an assessment and plan that was discussed with the patient.  > 50% of time was spent in direct patient care.  Signed, Addison Naegeli. Audie Box, [place]  [address] [place], [place] [address] [phone]  05/27/2020 2:54 PM

## 2020-05-30 ENCOUNTER — Ambulatory Visit: Payer: BC Managed Care – PPO | Admitting: Cardiovascular Disease

## 2020-05-30 ENCOUNTER — Inpatient Hospital Stay: Admission: RE | Admit: 2020-05-30 | Payer: BC Managed Care – PPO | Source: Ambulatory Visit

## 2020-05-30 DIAGNOSIS — I2510 Atherosclerotic heart disease of native coronary artery without angina pectoris: Secondary | ICD-10-CM

## 2020-06-07 DIAGNOSIS — M25552 Pain in left hip: Secondary | ICD-10-CM | POA: Diagnosis not present

## 2020-06-07 DIAGNOSIS — M25551 Pain in right hip: Secondary | ICD-10-CM | POA: Diagnosis not present

## 2020-06-12 ENCOUNTER — Other Ambulatory Visit (HOSPITAL_COMMUNITY)
Admission: RE | Admit: 2020-06-12 | Discharge: 2020-06-12 | Disposition: A | Discharge status: Home / Self Care | Payer: BC Managed Care – PPO | Source: Ambulatory Visit | Attending: Radiology | Admitting: Radiology

## 2020-06-12 ENCOUNTER — Ambulatory Visit
Admission: RE | Admit: 2020-06-12 | Discharge: 2020-06-12 | Disposition: A | Discharge status: Home / Self Care | Payer: BC Managed Care – PPO | Source: Ambulatory Visit | Attending: Otolaryngology | Admitting: Otolaryngology

## 2020-06-12 DIAGNOSIS — R7303 Prediabetes: Secondary | ICD-10-CM | POA: Diagnosis not present

## 2020-06-12 DIAGNOSIS — Z6832 Body mass index (BMI) 32.0-32.9, adult: Secondary | ICD-10-CM | POA: Diagnosis not present

## 2020-06-12 DIAGNOSIS — E041 Nontoxic single thyroid nodule: Secondary | ICD-10-CM | POA: Insufficient documentation

## 2020-06-13 DIAGNOSIS — M25551 Pain in right hip: Secondary | ICD-10-CM | POA: Diagnosis not present

## 2020-06-13 DIAGNOSIS — M25552 Pain in left hip: Secondary | ICD-10-CM | POA: Diagnosis not present

## 2020-06-13 LAB — CYTOLOGY - NON PAP

## 2020-06-15 ENCOUNTER — Encounter: Payer: Self-pay | Admitting: General Practice

## 2020-07-11 DIAGNOSIS — M1712 Unilateral primary osteoarthritis, left knee: Secondary | ICD-10-CM | POA: Diagnosis not present

## 2020-07-17 DIAGNOSIS — M25562 Pain in left knee: Secondary | ICD-10-CM | POA: Diagnosis not present

## 2020-07-17 DIAGNOSIS — M25551 Pain in right hip: Secondary | ICD-10-CM | POA: Diagnosis not present

## 2020-07-17 DIAGNOSIS — M25552 Pain in left hip: Secondary | ICD-10-CM | POA: Diagnosis not present

## 2020-07-17 DIAGNOSIS — M25561 Pain in right knee: Secondary | ICD-10-CM | POA: Diagnosis not present

## 2020-07-25 DIAGNOSIS — M25562 Pain in left knee: Secondary | ICD-10-CM | POA: Diagnosis not present

## 2020-07-25 DIAGNOSIS — M25551 Pain in right hip: Secondary | ICD-10-CM | POA: Diagnosis not present

## 2020-07-25 DIAGNOSIS — M25552 Pain in left hip: Secondary | ICD-10-CM | POA: Diagnosis not present

## 2020-07-25 DIAGNOSIS — M25561 Pain in right knee: Secondary | ICD-10-CM | POA: Diagnosis not present

## 2020-08-01 DIAGNOSIS — M25552 Pain in left hip: Secondary | ICD-10-CM | POA: Diagnosis not present

## 2020-08-01 DIAGNOSIS — M25561 Pain in right knee: Secondary | ICD-10-CM | POA: Diagnosis not present

## 2020-08-01 DIAGNOSIS — M25562 Pain in left knee: Secondary | ICD-10-CM | POA: Diagnosis not present

## 2020-08-01 DIAGNOSIS — M25551 Pain in right hip: Secondary | ICD-10-CM | POA: Diagnosis not present

## 2020-08-15 DIAGNOSIS — M25562 Pain in left knee: Secondary | ICD-10-CM | POA: Diagnosis not present

## 2020-08-15 DIAGNOSIS — M25561 Pain in right knee: Secondary | ICD-10-CM | POA: Diagnosis not present

## 2020-08-15 DIAGNOSIS — M25551 Pain in right hip: Secondary | ICD-10-CM | POA: Diagnosis not present

## 2020-08-15 DIAGNOSIS — M25552 Pain in left hip: Secondary | ICD-10-CM | POA: Diagnosis not present

## 2020-10-03 DIAGNOSIS — M25562 Pain in left knee: Secondary | ICD-10-CM | POA: Diagnosis not present

## 2020-10-03 DIAGNOSIS — M069 Rheumatoid arthritis, unspecified: Secondary | ICD-10-CM | POA: Diagnosis not present

## 2020-10-31 DIAGNOSIS — F3342 Major depressive disorder, recurrent, in full remission: Secondary | ICD-10-CM | POA: Diagnosis not present

## 2020-10-31 DIAGNOSIS — F410 Panic disorder [episodic paroxysmal anxiety] without agoraphobia: Secondary | ICD-10-CM | POA: Diagnosis not present

## 2021-03-13 DIAGNOSIS — M25562 Pain in left knee: Secondary | ICD-10-CM | POA: Diagnosis not present

## 2021-04-02 DIAGNOSIS — E559 Vitamin D deficiency, unspecified: Secondary | ICD-10-CM | POA: Diagnosis not present

## 2021-04-02 DIAGNOSIS — R7303 Prediabetes: Secondary | ICD-10-CM | POA: Diagnosis not present

## 2021-04-02 DIAGNOSIS — M1990 Unspecified osteoarthritis, unspecified site: Secondary | ICD-10-CM | POA: Diagnosis not present

## 2021-04-02 DIAGNOSIS — Z01818 Encounter for other preprocedural examination: Secondary | ICD-10-CM | POA: Diagnosis not present

## 2021-04-02 DIAGNOSIS — I2510 Atherosclerotic heart disease of native coronary artery without angina pectoris: Secondary | ICD-10-CM | POA: Diagnosis not present

## 2021-04-17 ENCOUNTER — Ambulatory Visit: Payer: Self-pay | Admitting: Orthopedic Surgery

## 2021-04-17 DIAGNOSIS — M1712 Unilateral primary osteoarthritis, left knee: Secondary | ICD-10-CM

## 2021-05-01 DIAGNOSIS — M549 Dorsalgia, unspecified: Secondary | ICD-10-CM | POA: Diagnosis not present

## 2021-05-01 DIAGNOSIS — M797 Fibromyalgia: Secondary | ICD-10-CM | POA: Diagnosis not present

## 2021-05-01 DIAGNOSIS — M1990 Unspecified osteoarthritis, unspecified site: Secondary | ICD-10-CM | POA: Diagnosis not present

## 2021-05-01 DIAGNOSIS — M0579 Rheumatoid arthritis with rheumatoid factor of multiple sites without organ or systems involvement: Secondary | ICD-10-CM | POA: Diagnosis not present

## 2021-05-01 DIAGNOSIS — M25569 Pain in unspecified knee: Secondary | ICD-10-CM | POA: Diagnosis not present

## 2021-05-06 DIAGNOSIS — F410 Panic disorder [episodic paroxysmal anxiety] without agoraphobia: Secondary | ICD-10-CM | POA: Diagnosis not present

## 2021-05-06 DIAGNOSIS — F3342 Major depressive disorder, recurrent, in full remission: Secondary | ICD-10-CM | POA: Diagnosis not present

## 2021-05-09 NOTE — Progress Notes (Addendum)
Anesthesia Review:   PCP: DR Deland Pretty  Cardiologist : none  HX of MVP  Chest x-ray : 05/01/2020- Ct Chest  EKG : Echo : Stress test: Cardiac Cath :  Activity level: can do a flight of stairs without difficulty  Sleep Study/ CPAP : none  Fasting Blood Sugar :      / Checks Blood Sugar -- times a day:   Blood Thinner/ Instructions /Last Dose: ASA / Instructions/ Last Dose :   81 mg Aspirin  Former smoker now vaping with liquid only with nicotine  81 mg aspirin  Covid test on 05/23/2020 at 0945am  Lab appt on 05/23/20 at 0945am .  Bag with instructions in lab.   Called pt at 2 pm on 05/14/21.  PT thought appt was at 1330.  Completed med hx and instructions via phone.  PT voiced understanding.  Lab and covid appt set up.

## 2021-05-09 NOTE — Progress Notes (Signed)
You will need to have a covid test done prior to surgery.  Date 05/23/2021.  Come thru Main Entrance to [facility] .  Have a seat in the lobby to the right as you come thru the door.  Dial [phone] and give them your name and let them know you are here for covid testing.     Your procedure is scheduled on:  05/27/2021.    Report to [facility] Main  Entrance   Report to admitting at    1000AM     Call this number if you have problems the morning of surgery [phone]    REMEMBER: NO  SOLID FOOD CANDY OR GUM AFTER MIDNIGHT. CLEAR LIQUIDS UNTIL     0945am      . NOTHING BY MOUTH EXCEPT CLEAR LIQUIDS UNTIL  0945am   . PLEASE FINISH ENSURE DRINK PER SURGEON ORDER  WHICH NEEDS TO BE COMPLETED AT  0945am     .      CLEAR LIQUID DIET   Foods Allowed                                                                    Coffee and tea, regular and decaf                            Fruit ices (not with fruit pulp)                                      Iced Popsicles                                    Carbonated beverages, regular and diet                                    Cranberry, grape and apple juices Sports drinks like Gatorade Lightly seasoned clear broth or consume(fat free) Sugar, honey syrup ___________________________________________________________________      BRUSH YOUR TEETH MORNING OF SURGERY AND RINSE YOUR MOUTH OUT, NO CHEWING GUM CANDY OR MINTS.     Take these medicines the morning of surgery with A SIP OF WATER:  clonazepam, eye drops as usual  DO NOT TAKE ANY DIABETIC MEDICATIONS DAY OF YOUR SURGERY                               You may not have any metal on your body including hair pins and              piercings  Do not wear jewelry, make-up, lotions, powders or perfumes, deodorant             Do not wear nail polish on your fingernails.  Do not shave  48 hours prior to surgery.              Men may shave face and neck.   Do not bring valuables  to the  hospital. [facility] IS NOT             RESPONSIBLE   FOR VALUABLES.  Contacts, dentures or bridgework may not be worn into surgery.  Leave suitcase in the car. After surgery it may be brought to your room.     Patients discharged the day of surgery will not be allowed to drive home. IF YOU ARE HAVING SURGERY AND GOING HOME THE SAME DAY, YOU MUST HAVE AN ADULT TO DRIVE YOU HOME AND BE WITH YOU FOR 24 HOURS. YOU MAY GO HOME BY TAXI OR UBER OR ORTHERWISE, BUT AN ADULT MUST ACCOMPANY YOU HOME AND STAY WITH YOU FOR 24 HOURS.  Name and phone number of your driver:  Special Instructions: N/A              Please read over the following fact sheets you were given: _____________________________________________________________________  [facility] - Preparing for Surgery Before surgery, you can play an important role.  Because skin is not sterile, your skin needs to be as free of germs as possible.  You can reduce the number of germs on your skin by washing with CHG (chlorahexidine gluconate) soap before surgery.  CHG is an antiseptic cleaner which kills germs and bonds with the skin to continue killing germs even after washing. Please DO NOT use if you have an allergy to CHG or antibacterial soaps.  If your skin becomes reddened/irritated stop using the CHG and inform your nurse when you arrive at Short Stay. Do not shave (including legs and underarms) for at least 48 hours prior to the first CHG shower.  You may shave your face/neck. Please follow these instructions carefully:  1.  Shower with CHG Soap the night before surgery and the  morning of Surgery.  2.  If you choose to wash your hair, wash your hair first as usual with your  normal  shampoo.  3.  After you shampoo, rinse your hair and body thoroughly to remove the  shampoo.                           4.  Use CHG as you would any other liquid soap.  You can apply chg directly  to the skin and wash                       Gently with a  scrungie or clean washcloth.  5.  Apply the CHG Soap to your body ONLY FROM THE NECK DOWN.   Do not use on face/ open                           Wound or open sores. Avoid contact with eyes, ears mouth and genitals (private parts).                       Wash face,  Genitals (private parts) with your normal soap.             6.  Wash thoroughly, paying special attention to the area where your surgery  will be performed.  7.  Thoroughly rinse your body with warm water from the neck down.  8.  DO NOT shower/wash with your normal soap after using and rinsing off  the CHG Soap.                9.  Pat yourself dry  with a clean towel.            10.  Wear clean pajamas.            11.  Place clean sheets on your bed the night of your first shower and do not  sleep with pets. Day of Surgery : Do not apply any lotions/deodorants the morning of surgery.  Please wear clean clothes to the hospital/surgery center.  FAILURE TO FOLLOW THESE INSTRUCTIONS MAY RESULT IN THE CANCELLATION OF YOUR SURGERY PATIENT SIGNATURE_________________________________  NURSE SIGNATURE__________________________________  ________________________________________________________________________

## 2021-05-14 ENCOUNTER — Encounter (HOSPITAL_COMMUNITY): Payer: Self-pay | Admitting: Specialist

## 2021-05-14 ENCOUNTER — Other Ambulatory Visit: Payer: Self-pay | Admitting: Acute Care

## 2021-05-14 ENCOUNTER — Other Ambulatory Visit: Payer: Self-pay

## 2021-05-14 ENCOUNTER — Encounter (HOSPITAL_COMMUNITY)
Admission: RE | Admit: 2021-05-14 | Discharge: 2021-05-14 | Disposition: A | Discharge status: Home / Self Care | Payer: BC Managed Care – PPO | Source: Ambulatory Visit | Attending: Specialist | Admitting: Specialist

## 2021-05-14 DIAGNOSIS — Z01818 Encounter for other preprocedural examination: Secondary | ICD-10-CM | POA: Insufficient documentation

## 2021-05-14 DIAGNOSIS — Z87891 Personal history of nicotine dependence: Secondary | ICD-10-CM

## 2021-05-15 DIAGNOSIS — M1812 Unilateral primary osteoarthritis of first carpometacarpal joint, left hand: Secondary | ICD-10-CM | POA: Diagnosis not present

## 2021-05-15 DIAGNOSIS — M1811 Unilateral primary osteoarthritis of first carpometacarpal joint, right hand: Secondary | ICD-10-CM | POA: Diagnosis not present

## 2021-05-23 ENCOUNTER — Encounter (HOSPITAL_COMMUNITY)
Admission: RE | Admit: 2021-05-23 | Discharge: 2021-05-23 | Disposition: A | Discharge status: Home / Self Care | Payer: BC Managed Care – PPO | Source: Ambulatory Visit | Attending: Specialist | Admitting: Specialist

## 2021-05-23 ENCOUNTER — Other Ambulatory Visit: Payer: Self-pay

## 2021-05-23 DIAGNOSIS — M1712 Unilateral primary osteoarthritis, left knee: Secondary | ICD-10-CM

## 2021-05-23 DIAGNOSIS — Z20822 Contact with and (suspected) exposure to covid-19: Secondary | ICD-10-CM | POA: Diagnosis not present

## 2021-05-23 DIAGNOSIS — Z01818 Encounter for other preprocedural examination: Secondary | ICD-10-CM

## 2021-05-23 DIAGNOSIS — Z01812 Encounter for preprocedural laboratory examination: Secondary | ICD-10-CM | POA: Insufficient documentation

## 2021-05-23 LAB — URINALYSIS, ROUTINE W REFLEX MICROSCOPIC
Bilirubin Urine: NEGATIVE
Glucose, UA: NEGATIVE mg/dL
Hgb urine dipstick: NEGATIVE
Ketones, ur: NEGATIVE mg/dL
Nitrite: NEGATIVE
Protein, ur: NEGATIVE mg/dL
Specific Gravity, Urine: 1.02 (ref 1.005–1.030)
pH: 5 (ref 5.0–8.0)

## 2021-05-23 LAB — CBC
HCT: 44.5 % (ref 36.0–46.0)
Hemoglobin: 14.5 g/dL (ref 12.0–15.0)
MCH: 28.2 pg (ref 26.0–34.0)
MCHC: 32.6 g/dL (ref 30.0–36.0)
MCV: 86.6 fL (ref 80.0–100.0)
Platelets: 229 10*3/uL (ref 150–400)
RBC: 5.14 MIL/uL — ABNORMAL HIGH (ref 3.87–5.11)
RDW: 13.7 % (ref 11.5–15.5)
WBC: 8.2 10*3/uL (ref 4.0–10.5)
nRBC: 0 % (ref 0.0–0.2)

## 2021-05-23 LAB — SARS CORONAVIRUS 2 (TAT 6-24 HRS): SARS Coronavirus 2: NEGATIVE

## 2021-05-23 LAB — SURGICAL PCR SCREEN
MRSA, PCR: NEGATIVE
Staphylococcus aureus: NEGATIVE

## 2021-05-23 LAB — BASIC METABOLIC PANEL
Anion gap: 9 (ref 5–15)
BUN: 15 mg/dL (ref 6–20)
CO2: 24 mmol/L (ref 22–32)
Calcium: 9.7 mg/dL (ref 8.9–10.3)
Chloride: 104 mmol/L (ref 98–111)
Creatinine, Ser: 0.76 mg/dL (ref 0.44–1.00)
GFR, Estimated: >60 mL/min (ref >60)
Glucose, Bld: 182 mg/dL — ABNORMAL HIGH (ref 70–99)
Potassium: 4.1 mmol/L (ref 3.5–5.1)
Sodium: 137 mmol/L (ref 135–145)

## 2021-05-23 LAB — PROTIME-INR
INR: 0.9 (ref 0.8–1.2)
Prothrombin Time: 12.2 seconds (ref 11.4–15.2)

## 2021-05-23 LAB — APTT: aPTT: 34 seconds (ref 24–36)

## 2021-05-23 NOTE — Progress Notes (Signed)
Lab. Results: UA: Large leukocytes. Bacteria: rare.

## 2021-05-23 NOTE — Progress Notes (Signed)
Patient reports no symtoms of covid,no recent exposures,no recent travel and no recent covid testing done on her arrival to pre-op testing secreening today.

## 2021-05-27 ENCOUNTER — Ambulatory Visit (HOSPITAL_COMMUNITY): Payer: BC Managed Care – PPO | Admitting: Anesthesiology

## 2021-05-27 ENCOUNTER — Encounter (HOSPITAL_COMMUNITY)
Admission: RE | Disposition: A | Discharge status: Home / Self Care | Payer: Self-pay | Source: Ambulatory Visit | Attending: Specialist

## 2021-05-27 ENCOUNTER — Ambulatory Visit (HOSPITAL_COMMUNITY)
Admission: RE | Admit: 2021-05-27 | Discharge: 2021-05-28 | Disposition: A | Discharge status: Home / Self Care | Payer: BC Managed Care – PPO | Source: Ambulatory Visit | Attending: Specialist | Admitting: Specialist

## 2021-05-27 ENCOUNTER — Ambulatory Visit (HOSPITAL_COMMUNITY): Payer: BC Managed Care – PPO

## 2021-05-27 ENCOUNTER — Other Ambulatory Visit: Payer: Self-pay

## 2021-05-27 ENCOUNTER — Ambulatory Visit (HOSPITAL_COMMUNITY): Payer: BC Managed Care – PPO | Admitting: Physician Assistant

## 2021-05-27 ENCOUNTER — Encounter (HOSPITAL_COMMUNITY): Payer: Self-pay | Admitting: Specialist

## 2021-05-27 DIAGNOSIS — Z79899 Other long term (current) drug therapy: Secondary | ICD-10-CM | POA: Insufficient documentation

## 2021-05-27 DIAGNOSIS — Z87891 Personal history of nicotine dependence: Secondary | ICD-10-CM | POA: Insufficient documentation

## 2021-05-27 DIAGNOSIS — M797 Fibromyalgia: Secondary | ICD-10-CM | POA: Insufficient documentation

## 2021-05-27 DIAGNOSIS — Z96659 Presence of unspecified artificial knee joint: Secondary | ICD-10-CM

## 2021-05-27 DIAGNOSIS — M069 Rheumatoid arthritis, unspecified: Secondary | ICD-10-CM | POA: Diagnosis not present

## 2021-05-27 DIAGNOSIS — Z7982 Long term (current) use of aspirin: Secondary | ICD-10-CM | POA: Insufficient documentation

## 2021-05-27 DIAGNOSIS — Z96652 Presence of left artificial knee joint: Secondary | ICD-10-CM | POA: Diagnosis not present

## 2021-05-27 DIAGNOSIS — M1712 Unilateral primary osteoarthritis, left knee: Secondary | ICD-10-CM | POA: Insufficient documentation

## 2021-05-27 DIAGNOSIS — G8918 Other acute postprocedural pain: Secondary | ICD-10-CM | POA: Diagnosis not present

## 2021-05-27 DIAGNOSIS — E669 Obesity, unspecified: Secondary | ICD-10-CM | POA: Diagnosis not present

## 2021-05-27 DIAGNOSIS — Z6831 Body mass index (BMI) 31.0-31.9, adult: Secondary | ICD-10-CM | POA: Insufficient documentation

## 2021-05-27 DIAGNOSIS — Z01818 Encounter for other preprocedural examination: Secondary | ICD-10-CM

## 2021-05-27 HISTORY — PX: TOTAL KNEE ARTHROPLASTY: SHX125

## 2021-05-27 LAB — HEMOGLOBIN A1C
Hgb A1c MFr Bld: 6.7 % — ABNORMAL HIGH (ref 4.8–5.6)
Mean Plasma Glucose: 145.59 mg/dL

## 2021-05-27 LAB — URINALYSIS, ROUTINE W REFLEX MICROSCOPIC
Bilirubin Urine: NEGATIVE
Glucose, UA: NEGATIVE mg/dL
Hgb urine dipstick: NEGATIVE
Ketones, ur: NEGATIVE mg/dL
Leukocytes,Ua: NEGATIVE
Nitrite: NEGATIVE
Protein, ur: NEGATIVE mg/dL
Specific Gravity, Urine: 1.025 (ref 1.005–1.030)
pH: 5 (ref 5.0–8.0)

## 2021-05-27 LAB — GLUCOSE, CAPILLARY
Glucose-Capillary: 145 mg/dL — ABNORMAL HIGH (ref 70–99)
Glucose-Capillary: 239 mg/dL — ABNORMAL HIGH (ref 70–99)

## 2021-05-27 SURGERY — ARTHROPLASTY, KNEE, TOTAL
Anesthesia: Spinal | Site: Knee | Laterality: Left

## 2021-05-27 MED ORDER — MIDAZOLAM HCL 2 MG/2ML IJ SOLN
INTRAMUSCULAR | Status: AC
  Filled 2021-05-27: qty 2

## 2021-05-27 MED ORDER — INSULIN ASPART 100 UNIT/ML IJ SOLN: 0.0000 [IU] | Freq: Three times a day (TID) | INTRAMUSCULAR | Status: DC

## 2021-05-27 MED ORDER — SODIUM CHLORIDE 0.9 % IR SOLN
Status: DC | PRN
  Administered 2021-05-27: 1000 mL

## 2021-05-27 MED ORDER — CIPROFLOXACIN IN D5W 400 MG/200ML IV SOLN
INTRAVENOUS | Status: DC | PRN
Start: 2021-05-27 — End: 2021-05-27
  Administered 2021-05-27: 400 mg via INTRAVENOUS

## 2021-05-27 MED ORDER — OXYMETAZOLINE HCL 0.05 % NA SOLN: 1.0000 | Freq: Two times a day (BID) | NASAL | Status: DC | PRN

## 2021-05-27 MED ORDER — FENTANYL CITRATE PF 50 MCG/ML IJ SOSY
PREFILLED_SYRINGE | INTRAMUSCULAR | Status: AC
  Administered 2021-05-27: 50 ug via INTRAVENOUS
  Filled 2021-05-27: qty 2

## 2021-05-27 MED ORDER — ACETAMINOPHEN 500 MG PO TABS
1000.0000 mg | ORAL_TABLET | Freq: Four times a day (QID) | ORAL | Status: DC
  Administered 2021-05-28 (×2): 1000 mg via ORAL
  Filled 2021-05-27 (×3): qty 2

## 2021-05-27 MED ORDER — CLONAZEPAM 1 MG PO TABS
1.0000 mg | ORAL_TABLET | Freq: Two times a day (BID) | ORAL | Status: DC
  Administered 2021-05-28: 1 mg via ORAL
  Filled 2021-05-27: qty 1

## 2021-05-27 MED ORDER — BUPIVACAINE-EPINEPHRINE (PF) 0.25% -1:200000 IJ SOLN
INTRAMUSCULAR | Status: AC
  Filled 2021-05-27: qty 30

## 2021-05-27 MED ORDER — ASPIRIN 81 MG PO CHEW
81.0000 mg | CHEWABLE_TABLET | Freq: Two times a day (BID) | ORAL | Status: DC
  Administered 2021-05-27 – 2021-05-28 (×2): 81 mg via ORAL
  Filled 2021-05-27 (×2): qty 1

## 2021-05-27 MED ORDER — POLYETHYLENE GLYCOL 3350 17 G PO PACK
17.0000 g | PACK | Freq: Every day | ORAL | Status: DC | PRN
Start: 2021-05-27 — End: 2021-05-28

## 2021-05-27 MED ORDER — OXYCODONE HCL 5 MG PO TABS
10.0000 mg | ORAL_TABLET | ORAL | Status: DC | PRN
  Administered 2021-05-27: 10 mg via ORAL
  Filled 2021-05-27: qty 2

## 2021-05-27 MED ORDER — DEXAMETHASONE SODIUM PHOSPHATE 10 MG/ML IJ SOLN
INTRAMUSCULAR | Status: DC | PRN
  Administered 2021-05-27: 8 mg via INTRAVENOUS

## 2021-05-27 MED ORDER — OXYCODONE HCL 5 MG PO TABS
5.0000 mg | ORAL_TABLET | ORAL | Status: DC | PRN
  Administered 2021-05-27 – 2021-05-28 (×2): 5 mg via ORAL
  Administered 2021-05-28: 10 mg via ORAL
  Administered 2021-05-28: 5 mg via ORAL
  Filled 2021-05-27: qty 1
  Filled 2021-05-27 (×2): qty 2
  Filled 2021-05-27: qty 1

## 2021-05-27 MED ORDER — DIPHENHYDRAMINE HCL 12.5 MG/5ML PO ELIX: 12.5000 mg | ORAL_SOLUTION | ORAL | Status: DC | PRN

## 2021-05-27 MED ORDER — PROPOFOL 10 MG/ML IV BOLUS
INTRAVENOUS | Status: DC | PRN
  Administered 2021-05-27: 170 mg via INTRAVENOUS

## 2021-05-27 MED ORDER — LIDOCAINE HCL (CARDIAC) PF 100 MG/5ML IV SOSY
PREFILLED_SYRINGE | INTRAVENOUS | Status: DC | PRN
Start: 2021-05-27 — End: 2021-05-27
  Administered 2021-05-27: 50 mg via INTRAVENOUS

## 2021-05-27 MED ORDER — POLYVINYL ALCOHOL 1.4 % OP SOLN
1.0000 [drp] | OPHTHALMIC | Status: DC | PRN
  Filled 2021-05-27: qty 15

## 2021-05-27 MED ORDER — CEFAZOLIN SODIUM-DEXTROSE 2-4 GM/100ML-% IV SOLN
2.0000 g | Freq: Four times a day (QID) | INTRAVENOUS | Status: AC
  Administered 2021-05-27 – 2021-05-28 (×2): 2 g via INTRAVENOUS
  Filled 2021-05-27 (×2): qty 100

## 2021-05-27 MED ORDER — PHENYLEPHRINE HCL (PRESSORS) 10 MG/ML IV SOLN
INTRAVENOUS | Status: DC | PRN
Start: 2021-05-27 — End: 2021-05-27
  Administered 2021-05-27 (×2): 40 ug via INTRAVENOUS

## 2021-05-27 MED ORDER — MIDAZOLAM HCL 5 MG/5ML IJ SOLN
INTRAMUSCULAR | Status: DC | PRN
  Administered 2021-05-27: 1 mg via INTRAVENOUS

## 2021-05-27 MED ORDER — BUPIVACAINE LIPOSOME 1.3 % IJ SUSP
INTRAMUSCULAR | Status: AC
  Filled 2021-05-27: qty 20

## 2021-05-27 MED ORDER — FENTANYL CITRATE PF 50 MCG/ML IJ SOSY
25.0000 ug | PREFILLED_SYRINGE | INTRAMUSCULAR | Status: DC | PRN
  Administered 2021-05-27 (×3): 50 ug via INTRAVENOUS

## 2021-05-27 MED ORDER — LACTATED RINGERS IV SOLN: INTRAVENOUS | Status: DC

## 2021-05-27 MED ORDER — ONDANSETRON HCL 4 MG/2ML IJ SOLN: 4.0000 mg | Freq: Four times a day (QID) | INTRAMUSCULAR | Status: DC | PRN

## 2021-05-27 MED ORDER — EPHEDRINE 5 MG/ML INJ
INTRAVENOUS | Status: AC
  Filled 2021-05-27: qty 5

## 2021-05-27 MED ORDER — STERILE WATER FOR IRRIGATION IR SOLN
Status: DC | PRN
  Administered 2021-05-27: 2000 mL

## 2021-05-27 MED ORDER — METOCLOPRAMIDE HCL 5 MG PO TABS: 5.0000 mg | ORAL_TABLET | Freq: Three times a day (TID) | ORAL | Status: DC | PRN

## 2021-05-27 MED ORDER — ALUM & MAG HYDROXIDE-SIMETH 200-200-20 MG/5ML PO SUSP
30.0000 mL | ORAL | Status: DC | PRN
Start: 2021-05-27 — End: 2021-05-28

## 2021-05-27 MED ORDER — ASPIRIN EC 81 MG PO TBEC: 81.0000 mg | DELAYED_RELEASE_TABLET | Freq: Two times a day (BID) | ORAL | 1 refills | Status: DC

## 2021-05-27 MED ORDER — OXYCODONE HCL 5 MG PO TABS
5.0000 mg | ORAL_TABLET | ORAL | 0 refills | Status: DC | PRN
Start: 2021-05-27 — End: 2022-12-02

## 2021-05-27 MED ORDER — LIDOCAINE HCL (PF) 2 % IJ SOLN
INTRAMUSCULAR | Status: AC
  Filled 2021-05-27: qty 5

## 2021-05-27 MED ORDER — CLONIDINE HCL (ANALGESIA) 100 MCG/ML EP SOLN
EPIDURAL | Status: DC | PRN
  Administered 2021-05-27: 50 ug

## 2021-05-27 MED ORDER — CEPHALEXIN 500 MG PO CAPS: 500.0000 mg | ORAL_CAPSULE | Freq: Four times a day (QID) | ORAL | Status: DC

## 2021-05-27 MED ORDER — FENTANYL CITRATE (PF) 100 MCG/2ML IJ SOLN
INTRAMUSCULAR | Status: DC | PRN
Start: 2021-05-27 — End: 2021-05-27
  Administered 2021-05-27: 50 ug via INTRAVENOUS
  Administered 2021-05-27: 75 ug via INTRAVENOUS
  Administered 2021-05-27 (×2): 50 ug via INTRAVENOUS

## 2021-05-27 MED ORDER — RISAQUAD PO CAPS
1.0000 | ORAL_CAPSULE | Freq: Every day | ORAL | Status: DC
  Administered 2021-05-27 – 2021-05-28 (×2): 1 via ORAL
  Filled 2021-05-27 (×2): qty 1

## 2021-05-27 MED ORDER — KETAMINE HCL 10 MG/ML IJ SOLN
INTRAMUSCULAR | Status: DC | PRN
  Administered 2021-05-27: 10 mg via INTRAVENOUS
  Administered 2021-05-27: 30 mg via INTRAVENOUS

## 2021-05-27 MED ORDER — PHENOL 1.4 % MT LIQD: 1.0000 | OROMUCOSAL | Status: DC | PRN

## 2021-05-27 MED ORDER — TRAZODONE HCL 50 MG PO TABS: 150.0000 mg | ORAL_TABLET | Freq: Every day | ORAL | Status: DC

## 2021-05-27 MED ORDER — 0.9 % SODIUM CHLORIDE (POUR BTL) OPTIME
TOPICAL | Status: DC | PRN
  Administered 2021-05-27: 1000 mL

## 2021-05-27 MED ORDER — IRRISEPT - 450ML BOTTLE WITH 0.05% CHG IN STERILE WATER, USP 99.95% OPTIME
TOPICAL | Status: DC | PRN
  Administered 2021-05-27: 450 mL via TOPICAL

## 2021-05-27 MED ORDER — CIPROFLOXACIN IN D5W 400 MG/200ML IV SOLN
INTRAVENOUS | Status: AC
  Filled 2021-05-27: qty 200

## 2021-05-27 MED ORDER — PROPYLENE GLYCOL 0.6 % OP SOLN: 1.0000 [drp] | Freq: Four times a day (QID) | OPHTHALMIC | Status: DC | PRN

## 2021-05-27 MED ORDER — MENTHOL 3 MG MT LOZG: 1.0000 | LOZENGE | OROMUCOSAL | Status: DC | PRN

## 2021-05-27 MED ORDER — KCL IN DEXTROSE-NACL 20-5-0.45 MEQ/L-%-% IV SOLN
INTRAVENOUS | Status: DC
  Filled 2021-05-27: qty 1000

## 2021-05-27 MED ORDER — KETAMINE HCL 10 MG/ML IJ SOLN
INTRAMUSCULAR | Status: AC
  Filled 2021-05-27: qty 1

## 2021-05-27 MED ORDER — DEXAMETHASONE SODIUM PHOSPHATE 4 MG/ML IJ SOLN
INTRAMUSCULAR | Status: DC | PRN
  Administered 2021-05-27: 3 mg via PERINEURAL

## 2021-05-27 MED ORDER — BUPIVACAINE-EPINEPHRINE 0.25% -1:200000 IJ SOLN
INTRAMUSCULAR | Status: DC | PRN
  Administered 2021-05-27: 30 mL

## 2021-05-27 MED ORDER — SERTRALINE HCL 100 MG PO TABS
200.0000 mg | ORAL_TABLET | Freq: Every day | ORAL | Status: DC
  Administered 2021-05-27: 200 mg via ORAL
  Filled 2021-05-27: qty 2

## 2021-05-27 MED ORDER — TRANEXAMIC ACID-NACL 1000-0.7 MG/100ML-% IV SOLN
1000.0000 mg | INTRAVENOUS | Status: AC
  Administered 2021-05-27: 1000 mg via INTRAVENOUS
  Filled 2021-05-27: qty 100

## 2021-05-27 MED ORDER — ACETAMINOPHEN 10 MG/ML IV SOLN
1000.0000 mg | INTRAVENOUS | Status: AC
  Administered 2021-05-27: 1000 mg via INTRAVENOUS
  Filled 2021-05-27: qty 100

## 2021-05-27 MED ORDER — FENTANYL CITRATE PF 50 MCG/ML IJ SOSY: 50.0000 ug | PREFILLED_SYRINGE | INTRAMUSCULAR | Status: DC

## 2021-05-27 MED ORDER — FENTANYL CITRATE (PF) 250 MCG/5ML IJ SOLN
INTRAMUSCULAR | Status: AC
  Filled 2021-05-27: qty 5

## 2021-05-27 MED ORDER — FENTANYL CITRATE PF 50 MCG/ML IJ SOSY
PREFILLED_SYRINGE | INTRAMUSCULAR | Status: AC
  Filled 2021-05-27: qty 3

## 2021-05-27 MED ORDER — POLYETHYLENE GLYCOL 3350 17 G PO PACK: 17.0000 g | PACK | Freq: Every day | ORAL | 0 refills | Status: DC

## 2021-05-27 MED ORDER — ONDANSETRON HCL 4 MG/2ML IJ SOLN
INTRAMUSCULAR | Status: AC
  Filled 2021-05-27: qty 2

## 2021-05-27 MED ORDER — MIDAZOLAM HCL 2 MG/2ML IJ SOLN
INTRAMUSCULAR | Status: AC
  Administered 2021-05-27: 2 mg via INTRAVENOUS
  Filled 2021-05-27: qty 2

## 2021-05-27 MED ORDER — OXYCODONE HCL 5 MG PO TABS: 5.0000 mg | ORAL_TABLET | Freq: Once | ORAL | Status: DC | PRN

## 2021-05-27 MED ORDER — CEFAZOLIN SODIUM-DEXTROSE 2-4 GM/100ML-% IV SOLN
2.0000 g | INTRAVENOUS | Status: AC
  Administered 2021-05-27: 2 g via INTRAVENOUS
  Filled 2021-05-27: qty 100

## 2021-05-27 MED ORDER — BUPIVACAINE LIPOSOME 1.3 % IJ SUSP
INTRAMUSCULAR | Status: DC | PRN
  Administered 2021-05-27: 20 mL

## 2021-05-27 MED ORDER — EPHEDRINE SULFATE 50 MG/ML IJ SOLN
INTRAMUSCULAR | Status: DC | PRN
  Administered 2021-05-27 (×2): 5 mg via INTRAVENOUS

## 2021-05-27 MED ORDER — DEXMEDETOMIDINE (PRECEDEX) IN NS 20 MCG/5ML (4 MCG/ML) IV SYRINGE
PREFILLED_SYRINGE | INTRAVENOUS | Status: DC | PRN
  Administered 2021-05-27: 4 ug via INTRAVENOUS
  Administered 2021-05-27: 8 ug via INTRAVENOUS

## 2021-05-27 MED ORDER — SODIUM CHLORIDE BACTERIOSTATIC 0.9 % IJ SOLN
INTRAMUSCULAR | Status: DC | PRN
  Administered 2021-05-27: 40 mL via INTRAMUSCULAR

## 2021-05-27 MED ORDER — ONDANSETRON HCL 4 MG PO TABS: 4.0000 mg | ORAL_TABLET | Freq: Four times a day (QID) | ORAL | Status: DC | PRN

## 2021-05-27 MED ORDER — VITAMIN D 25 MCG (1000 UNIT) PO TABS
1000.0000 [IU] | ORAL_TABLET | Freq: Every day | ORAL | Status: DC
  Administered 2021-05-28: 1000 [IU] via ORAL
  Filled 2021-05-27: qty 1

## 2021-05-27 MED ORDER — PHENYLEPHRINE 40 MCG/ML (10ML) SYRINGE FOR IV PUSH (FOR BLOOD PRESSURE SUPPORT)
PREFILLED_SYRINGE | INTRAVENOUS | Status: AC
  Filled 2021-05-27: qty 10

## 2021-05-27 MED ORDER — METOCLOPRAMIDE HCL 5 MG/ML IJ SOLN: 5.0000 mg | Freq: Three times a day (TID) | INTRAMUSCULAR | Status: DC | PRN

## 2021-05-27 MED ORDER — DOCUSATE SODIUM 100 MG PO CAPS
100.0000 mg | ORAL_CAPSULE | Freq: Two times a day (BID) | ORAL | Status: DC
  Administered 2021-05-27 – 2021-05-28 (×2): 100 mg via ORAL
  Filled 2021-05-27 (×2): qty 1

## 2021-05-27 MED ORDER — ROPIVACAINE HCL 7.5 MG/ML IJ SOLN
INTRAMUSCULAR | Status: DC | PRN
Start: 2021-05-27 — End: 2021-05-27
  Administered 2021-05-27: 20 mL via PERINEURAL

## 2021-05-27 MED ORDER — BISACODYL 5 MG PO TBEC: 5.0000 mg | DELAYED_RELEASE_TABLET | Freq: Every day | ORAL | Status: DC | PRN

## 2021-05-27 MED ORDER — DOCUSATE SODIUM 100 MG PO CAPS: 100.0000 mg | ORAL_CAPSULE | Freq: Two times a day (BID) | ORAL | 1 refills | Status: DC | PRN

## 2021-05-27 MED ORDER — PROPOFOL 1000 MG/100ML IV EMUL
INTRAVENOUS | Status: AC
  Filled 2021-05-27: qty 100

## 2021-05-27 MED ORDER — DEXAMETHASONE SODIUM PHOSPHATE 10 MG/ML IJ SOLN
INTRAMUSCULAR | Status: AC
  Filled 2021-05-27: qty 1

## 2021-05-27 MED ORDER — MIDAZOLAM HCL 2 MG/2ML IJ SOLN: 1.0000 mg | INTRAMUSCULAR | Status: DC

## 2021-05-27 MED ORDER — OXYCODONE HCL 5 MG/5ML PO SOLN: 5.0000 mg | Freq: Once | ORAL | Status: DC | PRN

## 2021-05-27 MED ORDER — FENTANYL CITRATE (PF) 100 MCG/2ML IJ SOLN
INTRAMUSCULAR | Status: AC
  Filled 2021-05-27: qty 2

## 2021-05-27 MED ORDER — ONDANSETRON HCL 4 MG/2ML IJ SOLN
INTRAMUSCULAR | Status: DC | PRN
Start: 2021-05-27 — End: 2021-05-27
  Administered 2021-05-27: 4 mg via INTRAVENOUS

## 2021-05-27 MED ORDER — ALBUMIN HUMAN 5 % IV SOLN
INTRAVENOUS | Status: AC
  Filled 2021-05-27: qty 250

## 2021-05-27 SURGICAL SUPPLY — 77 items
ATTUNE PSFEM LTSZ5 NARCEM KNEE (Femur) ×1 IMPLANT
ATTUNE PSRP INSR SZ5 5 KNEE (Insert) ×1 IMPLANT
BAG COUNTER SPONGE SURGICOUNT (BAG) IMPLANT
BAG DECANTER FOR FLEXI CONT (MISCELLANEOUS) ×2 IMPLANT
BAG ZIPLOCK 12X15 (MISCELLANEOUS) IMPLANT
BASEPLATE TIBIAL ROTATING SZ 4 (Knees) ×1 IMPLANT
BLADE SAW SGTL 11.0X1.19X90.0M (BLADE) ×2 IMPLANT
BLADE SAW SGTL 13.0X1.19X90.0M (BLADE) ×2 IMPLANT
BLADE SURG SZ10 CARB STEEL (BLADE) ×4 IMPLANT
BNDG COHESIVE 4X5 TAN ST LF (GAUZE/BANDAGES/DRESSINGS) ×2 IMPLANT
BNDG ELASTIC 4X5.8 VLCR STR LF (GAUZE/BANDAGES/DRESSINGS) ×2 IMPLANT
BNDG ELASTIC 6X10 VLCR STRL LF (GAUZE/BANDAGES/DRESSINGS) ×1 IMPLANT
BNDG ELASTIC 6X5.8 VLCR STR LF (GAUZE/BANDAGES/DRESSINGS) ×2 IMPLANT
BOWL SMART MIX CTS (DISPOSABLE) ×2 IMPLANT
CEMENT HV SMART SET (Cement) ×4 IMPLANT
COVER SURGICAL LIGHT HANDLE (MISCELLANEOUS) ×2 IMPLANT
CUFF TOURN SGL QUICK 34 (TOURNIQUET CUFF) ×1
CUFF TRNQT CYL 34X4.125X (TOURNIQUET CUFF) ×1 IMPLANT
DECANTER SPIKE VIAL GLASS SM (MISCELLANEOUS) ×2 IMPLANT
DRAPE INCISE IOBAN 66X45 STRL (DRAPES) ×2 IMPLANT
DRAPE ORTHO SPLIT 77X108 STRL (DRAPES) ×2
DRAPE SHEET LG 3/4 BI-LAMINATE (DRAPES) ×4 IMPLANT
DRAPE SURG ORHT 6 SPLT 77X108 (DRAPES) ×2 IMPLANT
DRAPE U-SHAPE 47X51 STRL (DRAPES) ×2 IMPLANT
DRSG AQUACEL AG ADV 3.5X10 (GAUZE/BANDAGES/DRESSINGS) ×2 IMPLANT
DRSG TEGADERM 4X4.75 (GAUZE/BANDAGES/DRESSINGS) IMPLANT
DURAPREP 26ML APPLICATOR (WOUND CARE) ×2 IMPLANT
ELECT BLADE TIP CTD 4 INCH (ELECTRODE) ×2 IMPLANT
ELECT REM PT RETURN 15FT ADLT (MISCELLANEOUS) ×2 IMPLANT
EVACUATOR 1/8 PVC DRAIN (DRAIN) IMPLANT
GAUZE SPONGE 2X2 8PLY STRL LF (GAUZE/BANDAGES/DRESSINGS) IMPLANT
GLOVE SRG 8 PF TXTR STRL LF DI (GLOVE) ×1 IMPLANT
GLOVE SURG POLYISO LF SZ7.5 (GLOVE) ×4 IMPLANT
GLOVE SURG POLYISO LF SZ8 (GLOVE) ×4 IMPLANT
GLOVE SURG UNDER POLY LF SZ7.5 (GLOVE) ×2 IMPLANT
GLOVE SURG UNDER POLY LF SZ8 (GLOVE) ×1
GOWN STRL REUS W/TWL XL LVL3 (GOWN DISPOSABLE) ×4 IMPLANT
HANDPIECE INTERPULSE COAX TIP (DISPOSABLE) ×1
HEMOSTAT SPONGE AVITENE ULTRA (HEMOSTASIS) IMPLANT
HOLDER FOLEY CATH W/STRAP (MISCELLANEOUS) IMPLANT
IMMOBILIZER KNEE 20 (SOFTGOODS) ×2
IMMOBILIZER KNEE 20 THIGH 36 (SOFTGOODS) ×1 IMPLANT
JET LAVAGE IRRISEPT WOUND (IRRIGATION / IRRIGATOR) ×2
KIT TURNOVER KIT A (KITS) IMPLANT
LAVAGE JET IRRISEPT WOUND (IRRIGATION / IRRIGATOR) ×1 IMPLANT
MANIFOLD NEPTUNE II (INSTRUMENTS) ×2 IMPLANT
NDL SAFETY ECLIPSE 18X1.5 (NEEDLE) IMPLANT
NEEDLE HYPO 18GX1.5 SHARP (NEEDLE)
NS IRRIG 1000ML POUR BTL (IV SOLUTION) IMPLANT
PACK TOTAL KNEE CUSTOM (KITS) ×2 IMPLANT
PATELLA MEDIAL ATTUN 35MM KNEE (Knees) ×1 IMPLANT
PROTECTOR NERVE ULNAR (MISCELLANEOUS) ×2 IMPLANT
SAW OSC TIP CART 19.5X105X1.3 (SAW) ×2 IMPLANT
SEALER BIPOLAR AQUA 6.0 (INSTRUMENTS) ×2 IMPLANT
SET HNDPC FAN SPRY TIP SCT (DISPOSABLE) ×1 IMPLANT
SPONGE GAUZE 2X2 STER 10/PKG (GAUZE/BANDAGES/DRESSINGS)
SPONGE SURGIFOAM ABS GEL 100 (HEMOSTASIS) IMPLANT
SPONGE T-LAP 18X18 ~~LOC~~+RFID (SPONGE) ×6 IMPLANT
STAPLER VISISTAT (STAPLE) IMPLANT
STRIP CLOSURE SKIN 1/2X4 (GAUZE/BANDAGES/DRESSINGS) IMPLANT
SUT BONE WAX W31G (SUTURE) ×2 IMPLANT
SUT MNCRL AB 4-0 PS2 18 (SUTURE) IMPLANT
SUT STRATAFIX 0 PDS 27 VIOLET (SUTURE) ×4
SUT VIC AB 1 CT1 27 (SUTURE) ×2
SUT VIC AB 1 CT1 27XBRD ANTBC (SUTURE) ×2 IMPLANT
SUT VIC AB 1 CTX 36 (SUTURE)
SUT VIC AB 1 CTX36XBRD ANBCTR (SUTURE) IMPLANT
SUT VIC AB 2-0 CT1 27 (SUTURE) ×3
SUT VIC AB 2-0 CT1 TAPERPNT 27 (SUTURE) ×3 IMPLANT
SUTURE STRATFX 0 PDS 27 VIOLET (SUTURE) ×1 IMPLANT
SYR 3ML LL SCALE MARK (SYRINGE) IMPLANT
SYR 50ML LL SCALE MARK (SYRINGE) IMPLANT
TAPE STRIPS DRAPE STRL (GAUZE/BANDAGES/DRESSINGS) ×1 IMPLANT
TRAY FOLEY MTR SLVR 16FR STAT (SET/KITS/TRAYS/PACK) ×2 IMPLANT
WATER STERILE IRR 1000ML POUR (IV SOLUTION) ×2 IMPLANT
WIPE CHG CHLORHEXIDINE 2% (PERSONAL CARE ITEMS) ×2 IMPLANT
WRAP KNEE MAXI GEL POST OP (GAUZE/BANDAGES/DRESSINGS) ×2 IMPLANT

## 2021-05-27 NOTE — Discharge Instructions (Signed)
Elevate leg above heart 6x a day for 60minutes each Use knee immobilizer while walking until can SLR x 10 Use knee immobilizer in bed to keep knee in extension Aquacel dressing may remain in place until follow up. May shower with aquacel dressing in place. If the dressing becomes saturated or peels off, you may remove aquacel dressing. Do not remove steri-strips if they are present. Place new dressing with gauze and tape or ACE bandage which should be kept clean and dry and changed daily.   INSTRUCTIONS AFTER JOINT REPLACEMENT   Remove items at home which could result in a fall. This includes throw rugs or furniture in walking pathways ICE to the affected joint every three hours while awake for 30 minutes at a time, for at least the first 3-5 days, and then as needed for pain and swelling.  Continue to use ice for pain and swelling. You may notice swelling that will progress down to the foot and ankle.  This is normal after surgery.  Elevate your leg when you are not up walking on it.   Continue to use the breathing machine you got in the hospital (incentive spirometer) which will help keep your temperature down.  It is common for your temperature to cycle up and down following surgery, especially at night when you are not up moving around and exerting yourself.  The breathing machine keeps your lungs expanded and your temperature down.   DIET:  As you were doing prior to hospitalization, we recommend a well-balanced diet.  DRESSING / WOUND CARE / SHOWERING  Keep the surgical dressing until follow up.  The dressing is water proof, so you can shower without any extra covering.  IF THE DRESSING FALLS OFF or the wound gets wet inside, change the dressing with sterile gauze.  Please use good hand washing techniques before changing the dressing.  Do not use any lotions or creams on the incision until instructed by your surgeon.    ACTIVITY  Increase activity slowly as tolerated, but follow the weight  bearing instructions below.   No driving for 6 weeks or until further direction given by your physician.  You cannot drive while taking narcotics.  No lifting or carrying greater than 10 lbs. until further directed by your surgeon. Avoid periods of inactivity such as sitting longer than an hour when not asleep. This helps prevent blood clots.  You may return to work once you are authorized by your doctor.     WEIGHT BEARING   Weight bearing as tolerated with assist device (walker, cane, etc) as directed, use it as long as suggested by your surgeon or therapist, typically at least 4-6 weeks.   EXERCISES  Results after joint replacement surgery are often greatly improved when you follow the exercise, range of motion and muscle strengthening exercises prescribed by your doctor. Safety measures are also important to protect the joint from further injury. Any time any of these exercises cause you to have increased pain or swelling, decrease what you are doing until you are comfortable again and then slowly increase them. If you have problems or questions, call your caregiver or physical therapist for advice.   Rehabilitation is important following a joint replacement. After just a few days of immobilization, the muscles of the leg can become weakened and shrink (atrophy).  These exercises are designed to build up the tone and strength of the thigh and leg muscles and to improve motion. Often times heat used for twenty to thirty  minutes before working out will loosen up your tissues and help with improving the range of motion but do not use heat for the first two weeks following surgery (sometimes heat can increase post-operative swelling).   These exercises can be done on a training (exercise) mat, on the floor, on a table or on a bed. Use whatever works the best and is most comfortable for you.    Use music or television while you are exercising so that the exercises are a pleasant break in your day.  This will make your life better with the exercises acting as a break in your routine that you can look forward to.   Perform all exercises about fifteen times, three times per day or as directed.  You should exercise both the operative leg and the other leg as well.  Exercises include:   Quad Sets - Tighten up the muscle on the front of the thigh (Quad) and hold for 5-10 seconds.   Straight Leg Raises - With your knee straight (if you were given a brace, keep it on), lift the leg to 60 degrees, hold for 3 seconds, and slowly lower the leg.  Perform this exercise against resistance later as your leg gets stronger.  Leg Slides: Lying on your back, slowly slide your foot toward your buttocks, bending your knee up off the floor (only go as far as is comfortable). Then slowly slide your foot back down until your leg is flat on the floor again.  Angel Wings: Lying on your back spread your legs to the side as far apart as you can without causing discomfort.  Hamstring Strength:  Lying on your back, push your heel against the floor with your leg straight by tightening up the muscles of your buttocks.  Repeat, but this time bend your knee to a comfortable angle, and push your heel against the floor.  You may put a pillow under the heel to make it more comfortable if necessary.   A rehabilitation program following joint replacement surgery can speed recovery and prevent re-injury in the future due to weakened muscles. Contact your doctor or a physical therapist for more information on knee rehabilitation.    CONSTIPATION  Constipation is defined medically as fewer than three stools per week and severe constipation as less than one stool per week.  Even if you have a regular bowel pattern at home, your normal regimen is likely to be disrupted due to multiple reasons following surgery.  Combination of anesthesia, postoperative narcotics, change in appetite and fluid intake all can affect your bowels.   YOU MUST  use at least one of the following options; they are listed in order of increasing strength to get the job done.  They are all available over the counter, and you may need to use some, POSSIBLY even all of these options:    Drink plenty of fluids (prune juice may be helpful) and high fiber foods Colace 100 mg by mouth twice a day  Senokot for constipation as directed and as needed Dulcolax (bisacodyl), take with full glass of water  Miralax (polyethylene glycol) once or twice a day as needed.  If you have tried all these things and are unable to have a bowel movement in the first 3-4 days after surgery call either your surgeon or your primary doctor.    If you experience loose stools or diarrhea, hold the medications until you stool forms back up.  If your symptoms do not get better within  1 week or if they get worse, check with your doctor.  If you experience "the worst abdominal pain ever" or develop nausea or vomiting, please contact the office immediately for further recommendations for treatment.   ITCHING:  If you experience itching with your medications, try taking only a single pain pill, or even half a pain pill at a time.  You can also use Benadryl over the counter for itching or also to help with sleep.   TED HOSE STOCKINGS:  Use stockings on both legs until for at least 2 weeks or as directed by physician office. They may be removed at night for sleeping.  MEDICATIONS:  See your medication summary on the After Visit Summary that nursing will review with you.  You may have some home medications which will be placed on hold until you complete the course of blood thinner medication.  It is important for you to complete the blood thinner medication as prescribed.  PRECAUTIONS:  If you experience chest pain or shortness of breath - call 911 immediately for transfer to the hospital emergency department.   If you develop a fever greater that 101 F, purulent drainage from wound, increased  redness or drainage from wound, foul odor from the wound/dressing, or calf pain - CONTACT YOUR SURGEON.                                                   FOLLOW-UP APPOINTMENTS:  If you do not already have a post-op appointment, please call the office for an appointment to be seen by your surgeon.  Guidelines for how soon to be seen are listed in your After Visit Summary, but are typically between 1-4 weeks after surgery.  OTHER INSTRUCTIONS:   Knee Replacement:  Do not place pillow under knee, focus on keeping the knee straight while resting. CPM instructions: 0-90 degrees, 2 hours in the morning, 2 hours in the afternoon, and 2 hours in the evening. Place foam block, curve side up under heel at all times except when in CPM or when walking.  DO NOT modify, tear, cut, or change the foam block in any way.  POST-OPERATIVE OPIOID TAPER INSTRUCTIONS: It is important to wean off of your opioid medication as soon as possible. If you do not need pain medication after your surgery it is ok to stop day one. Opioids include: Codeine, Hydrocodone(Norco, Vicodin), Oxycodone(Percocet, oxycontin) and hydromorphone amongst others.  Long term and even short term use of opiods can cause: Increased pain response Dependence Constipation Depression Respiratory depression And more.  Withdrawal symptoms can include Flu like symptoms Nausea, vomiting And more Techniques to manage these symptoms Hydrate well Eat regular healthy meals Stay active Use relaxation techniques(deep breathing, meditating, yoga) Do Not substitute Alcohol to help with tapering If you have been on opioids for less than two weeks and do not have pain than it is ok to stop all together.  Plan to wean off of opioids This plan should start within one week post op of your joint replacement. Maintain the same interval or time between taking each dose and first decrease the dose.  Cut the total daily intake of opioids by one tablet each  day Next start to increase the time between doses. The last dose that should be eliminated is the evening dose.   MAKE SURE YOU:  Understand  these instructions.  Get help right away if you are not doing well or get worse.    Thank you for letting us be a part of your medical care team.  It is a privilege we respect greatly.  We hope these instructions will help you stay on track for a fast and full recovery!      

## 2021-05-27 NOTE — Transfer of Care (Signed)
Immediate Anesthesia Transfer of Care Note  Patient: Heather Douglas  Procedure(s) Performed: TOTAL KNEE ARTHROPLASTY (Left: Knee)  Patient Location: PACU  Anesthesia Type:General  Level of Consciousness: awake, alert , oriented and pateint uncooperative  Airway & Oxygen Therapy: Patient Spontanous Breathing and Patient connected to face mask oxygen  Post-op Assessment: Report given to RN and Post -op Vital signs reviewed and stable  Post vital signs: Reviewed and stable  Last Vitals:  Vitals Value Taken Time  BP 143/82 05/27/21 1550  Temp    Pulse 93 05/27/21 1554  Resp 14 05/27/21 1553  SpO2 100 % 05/27/21 1554  Vitals shown include unvalidated device data.  Last Pain:  Vitals:   05/27/21 1020  TempSrc:   PainSc: 4       Patients Stated Pain Goal: 3 (00/86/76 1950)  Complications: No notable events documented.

## 2021-05-27 NOTE — Plan of Care (Signed)
°  Problem: Pain Management: Goal: Pain level will decrease with appropriate interventions Outcome: Progressing   Problem: Education: Goal: Knowledge of the prescribed therapeutic regimen will improve Outcome: Progressing   Problem: Activity: Goal: Ability to avoid complications of mobility impairment will improve Outcome: [place], RN 05/27/21 7:48 PM

## 2021-05-27 NOTE — Interval H&P Note (Signed)
History and Physical Interval Note:  05/27/2021 12:44 PM  Heather Douglas  has presented today for surgery, with the diagnosis of Left knee degenerative joint disease.  The various methods of treatment have been discussed with the patient and family. After consideration of risks, benefits and other options for treatment, the patient has consented to  Procedure(s): TOTAL KNEE ARTHROPLASTY (Left) as a surgical intervention.  The patient's history has been reviewed, patient examined, no change in status, stable for surgery.  I have reviewed the patient's chart and labs.  Questions were answered to the patient's satisfaction.     Johnn Hai

## 2021-05-27 NOTE — Plan of Care (Signed)
°  Problem: Education: Goal: Knowledge of the prescribed therapeutic regimen will improve 05/27/2021 2344 by Abagail Kitchens, RN Outcome: Progressing 05/27/2021 2343 by Abagail Kitchens, RN Outcome: Progressing   Problem: Pain Management: Goal: Pain level will decrease with appropriate interventions 05/27/2021 2344 by Abagail Kitchens, RN Outcome: Progressing 05/27/2021 2343 by Abagail Kitchens, RN Outcome: Progressing   Problem: Education: Goal: Knowledge of General Education information will improve Description: Including pain rating scale, medication(s)/side effects and non-pharmacologic comfort measures Outcome: Progressing   Problem: Coping: Goal: Level of anxiety will decrease Outcome: Progressing

## 2021-05-27 NOTE — Progress Notes (Signed)
AssistedDr. Lissa Hoard with left, ultrasound guided, adductor canal block. Side rails up, monitors on throughout procedure. See vital signs in flow sheet. Tolerated Procedure well.

## 2021-05-27 NOTE — H&P (Signed)
Heather Douglas is an 61 y.o. female.   Chief Complaint: left knee pain HPI: 61 year old female presents with left knee pain and end-stage osteoarthrosis of the left knee particularly medial compartment.  Failing conservative treatment included physical therapy activity modification corticosteroid injections.  She presents for a left total knee replacement.  Past Medical History:  Diagnosis Date   Anxiety    Cataracts, bilateral    Depression    Fibromyalgia    Heart murmur    History of ear infections    hx of in childhood hx of perorated eardrums bilat    Hyperlipidemia    Mitral valve regurgitation    Rheumatoid arthritis ([place])    Uterine cancer ([place])     Past Surgical History:  Procedure Laterality Date   ABDOMINAL HYSTERECTOMY     APPENDECTOMY     CARDIAC CATHETERIZATION     2013   colonscopy      removed polyps (benign)   EYE SURGERY     lasix twice to left eye and once to right    LAMINECTOMY     LUMBAR LAMINECTOMY/DECOMPRESSION MICRODISCECTOMY Right 05/18/2014   Procedure: MICRO LUMBAR DECOMPRESSION L4 - L5 ON THE RIGHT 1 LEVEL;  Surgeon: Johnn Hai, MD;  Location: [facility];  Service: Orthopedics;  Laterality: Right;   TONSILLECTOMY     T&A    Family History  Problem Relation Age of Onset   Diabetes Mother    Hypertension Mother    Hyperlipidemia Mother    Arthritis Mother    Arthritis Paternal Grandfather    Social History:  reports that she quit smoking about 7 years ago. Her smoking use included cigarettes. She has a 68.00 pack-year smoking history. She has never used smokeless tobacco. She reports that she does not drink alcohol and does not use drugs.  Allergies:  Allergies  Allergen Reactions   Bee Venom Anaphylaxis   Dilaudid [Hydromorphone Hcl] Itching   Erythromycin Other (See Comments)    "Severe GI distress"    Medications Prior to Admission  Medication Sig Dispense Refill   aspirin 81 MG tablet Take 1 tablet (81 mg total) by mouth daily.  Resume 4 days post-op 30 tablet    atorvastatin (LIPITOR) 40 MG tablet Take 40 mg by mouth daily.     cholecalciferol (VITAMIN D3) 25 MCG (1000 UNIT) tablet Take 1,000 Units by mouth daily.     clonazePAM (KLONOPIN) 1 MG tablet Take 1 mg by mouth 2 (two) times daily.     hydroxychloroquine (PLAQUENIL) 200 MG tablet Take 400 mg by mouth daily.     ibuprofen (ADVIL,MOTRIN) 800 MG tablet Take 1 tablet (800 mg total) by mouth every 8 (eight) hours as needed. 60 tablet 0   oxymetazoline (AFRIN) 0.05 % nasal spray Place 1 spray into both nostrils 2 (two) times daily as needed for congestion.     Propylene Glycol (SYSTANE BALANCE) 0.6 % SOLN Place 1 drop into both eyes 4 (four) times daily as needed (dry eyes).     sertraline (ZOLOFT) 100 MG tablet Take 200 mg by mouth at bedtime.     traZODone (DESYREL) 150 MG tablet Take 150 mg by mouth at bedtime.      No results found for this or any previous visit (from the past 48 hour(s)). No results found.  Review of Systems  Musculoskeletal:  Positive for joint swelling.  All other systems reviewed and are negative.  Blood pressure (!) 152/76, pulse 92, temperature 98.6 F (37 C),  temperature source Oral, resp. rate 18, height 5\' 7"  (1.702 m), weight 90.3 kg, SpO2 96 %. Physical Exam Vitals reviewed.  HENT:     Head: Normocephalic.  Eyes:     Pupils: Pupils are equal, round, and reactive to light.  Cardiovascular:     Rate and Rhythm: Normal rate.  Pulmonary:     Effort: Pulmonary effort is normal.  Abdominal:     General: Bowel sounds are normal.  Musculoskeletal:        General: Swelling and tenderness present.     Cervical back: Normal range of motion.     Comments: Left knee she is tender medial joint line.  Patellofemoral pain compression.  Mild effusion.  Ranges 0-1 10.  No DVT.  Ipsilateral hip and ankle exam is unremarkable.  Good pulses distally.  Skin:    General: Skin is warm and dry.  Neurological:     General: No focal deficit  present.    X-rays of the left knee demonstrate end-stage osteoarthrosis with bone-on-bone apposition in the medial compartment of the left knee.  Patellofemoral arthrosis noted as well  Assessment/Plan  End-stage osteoarthrosis of the left knee  Plan:  Proceed with a left total knee arthroplasty.  Risk and benefits discussed including bleeding, infection, damage to neurovascular ruptures DVT, PE, anesthetic complications arthrofibrosis late-term loosening etc.  Johnn Hai, MD 05/27/2021, 12:41 PM

## 2021-05-27 NOTE — Evaluation (Signed)
Physical Therapy Evaluation Patient Details Name: Heather Douglas MRN: [id number] DOB: 1960/05/29 Today's Date: 05/27/2021  History of Present Illness  pt s/p LTKA 05/27/2021 with PMH for fibromylagia, depression and L4-L5 lumbar laminectomy 2015.  Clinical Impression  Pt s/p LTKA presents with decreased sensation and strength at the time of assessment due to spinal block still a little intact. Pt also frustrated with how the pain medicine is making her feel and she is not herself and not cognitively making correct thoughts, and conversation. ( Very out of it and not holding her eyes open much , but still able to talk).  Pt's BP is also low supine, and was able to sit edge of bed with minA and was nto able to recheck BP at EOB but pt reported dizziness. Worked with educating pt on LLE positioning, but will have to continue to educate.Will continue to follow while in acute care.         Recommendations for follow up therapy are one component of a multi-disciplinary discharge planning process, led by the attending physician.  Recommendations may be updated based on patient status, additional functional criteria and insurance authorization.  Follow Up Recommendations Follow physician's recommendations for discharge plan and follow up therapies (pt states she is having HHPT (?))    Assistance Recommended at Discharge Frequent or constant Supervision/Assistance  Patient can return home with the following  A lot of help with walking and/or transfers;A lot of help with bathing/dressing/bathroom;Help with stairs or ramp for entrance    Equipment Recommendations Rolling walker (2 wheels) (may also need a cane)  Recommendations for Other Services       Functional Status Assessment Patient has had a recent decline in their functional status and demonstrates the ability to make significant improvements in function in a reasonable and predictable amount of time.     Precautions / Restrictions  Precautions Precautions: Knee Precaution Comments: educated a lot about resting position and to prevent knee flexion and pillows under the knee, Will continue to reinformce this as pt was not very alert Required Braces or Orthoses: Knee Immobilizer - Left Knee Immobilizer - Left: Discontinue once straight leg raise with < 10 degree lag;On when out of bed or walking Restrictions Weight Bearing Restrictions: No      Mobility  Bed Mobility Overal bed mobility: Needs Assistance Bed Mobility: Supine to Sit;Sit to Supine     Supine to sit: Min assist Sit to supine: Min assist   General bed mobility comments: assist with upper body and LLE due to numbness and inability for quad activation at this time. Only sat Edge of bed due to low BP, decresase cognitive state and numb LLE.    Transfers                        Ambulation/Gait                  Stairs            Wheelchair Mobility    Modified Rankin (Stroke Patients Only)       Balance                                             Pertinent Vitals/Pain Pain Assessment: 0-10 Pain Score: 5  Pain Location: L knee and thigh , as well as right lateral low back  area Pain Descriptors / Indicators: Aching;Sore Pain Intervention(s): Limited activity within patient's tolerance;Monitored during session;Repositioned    Home Living Family/patient expects to be discharged to:: Private residence Living Arrangements: Spouse/significant other Available Help at Discharge: Family;Available PRN/intermittently Type of Home: Apartment Home Access: Stairs to enter Entrance Stairs-Rails: Left Entrance Stairs-Number of Steps: 17 ( with a landing in the middle)   Home Layout: One level Home Equipment: Crutches      Prior Function Prior Level of Function : Independent/Modified Independent             Mobility Comments: pt states she didn't get out much and didn't do a lot ( and then began to  cry as she described this)       Hand Dominance        Extremity/Trunk Assessment        Lower Extremity Assessment Lower Extremity Assessment: LLE deficits/detail LLE Deficits / Details: grossly 10 degrees from full extension due to pt iwth very little active quad extension at this time due to nerve block. Pt unable to perfrom ALR or quad set as this time. performed all AAROM and pt with grossly 80 degrees of flexion, full DF /. PF intact       Communication   Communication: No difficulties  Cognition Arousal/Alertness: Awake/alert;Suspect due to medications (pt reported she felt very weird due to the medicines) Behavior During Therapy: Restless Overall Cognitive Status: Impaired/Different from baseline Area of Impairment: Orientation;Safety/judgement (due to pain medicines)                 Orientation Level: Disoriented to;Place                      General Comments      Exercises     Assessment/Plan    PT Assessment Patient needs continued PT services  PT Problem List Decreased strength;Decreased range of motion;Decreased activity tolerance;Decreased balance;Decreased mobility;Decreased safety awareness       PT Treatment Interventions DME instruction;Therapeutic activities;Therapeutic exercise;Gait training;Stair training;Balance training;Functional mobility training;Patient/family education    PT Goals (Current goals can be found in the Care Plan section)  Acute Rehab PT Goals Patient Stated Goal: I want to get back to moving better and with my grandbaby PT Goal Formulation: With patient Time For Goal Achievement: 06/10/21 Potential to Achieve Goals: Good    Frequency 7X/week     Co-evaluation               AM-PAC PT "6 Clicks" Mobility  Outcome Measure Help needed turning from your back to your side while in a flat bed without using bedrails?: A Little Help needed moving from lying on your back to sitting on the side of a flat bed  without using bedrails?: A Little Help needed moving to and from a bed to a chair (including a wheelchair)?: A Lot Help needed standing up from a chair using your arms (e.g., wheelchair or bedside chair)?: A Lot Help needed to walk in hospital room?: A Lot Help needed climbing 3-5 steps with a railing? : A Lot 6 Click Score: 14    End of Session   Activity Tolerance: Patient limited by lethargy Patient left: in bed;with call bell/phone within reach;with bed alarm set Nurse Communication: Mobility status PT Visit Diagnosis: Other abnormalities of gait and mobility (R26.89);Muscle weakness (generalized) (M62.81)    Time: 4081-4481 PT Time Calculation (min) (ACUTE ONLY): 35 min   Charges:   PT Evaluation $PT Eval Low Complexity: 1  Low PT Treatments $Therapeutic Activity: 8-22 mins        Gatha Mayer, PT, MPT Acute Rehabilitation Services Office: [phone] Pager: [phone] 05/27/2021   Clide Dales 05/27/2021, 8:29 PM

## 2021-05-27 NOTE — Brief Op Note (Signed)
05/27/2021  3:31 PM  PATIENT:  Heather Douglas  61 y.o. female  PRE-OPERATIVE DIAGNOSIS:  Left knee degenerative joint disease  POST-OPERATIVE DIAGNOSIS:  Left knee degenerative joint disease  PROCEDURE:  Procedure(s): TOTAL KNEE ARTHROPLASTY (Left)     SURGEON:  Surgeon(s) and Role:    Susa Day, MD - Primary  PHYSICIAN ASSISTANT:   ASSISTANTS: Biss[place]   ANESTHESIA:   spinal  EBL:  40 mL   BLOOD ADMINISTERED:none  DRAINS: none   LOCAL MEDICATIONS USED:  MARCAINE     SPECIMEN:  No Specimen  DISPOSITION OF SPECIMEN:  N/A  COUNTS:  YES  TOURNIQUET:   Total Tourniquet Time Documented: Thigh (Left) - 58 minutes Total: Thigh (Left) - 58 minutes   DICTATION: .Other Dictation: Dictation Number [id number]  PLAN OF CARE: Admit for overnight observation  PATIENT DISPOSITION:  PACU - hemodynamically stable.   Delay start of Pharmacological VTE agent (>24hrs) due to surgical blood loss or risk of bleeding: no

## 2021-05-27 NOTE — Anesthesia Preprocedure Evaluation (Addendum)
Anesthesia Evaluation  Patient identified by MRN, date of birth, ID band Patient awake    Reviewed: Allergy & Precautions, NPO status , Patient's Chart, lab work & pertinent test results, reviewed documented beta blocker date and time   Airway Mallampati: II  TM Distance: >3 FB Neck ROM: Full    Dental no notable dental hx. (+) Teeth Intact, Dental Advisory Given, Caps   Pulmonary former smoker,    Pulmonary exam normal breath sounds clear to auscultation       Cardiovascular negative cardio ROS Normal cardiovascular exam+ Valvular Problems/Murmurs MR  Rhythm:Regular Rate:Normal     Neuro/Psych PSYCHIATRIC DISORDERS Anxiety Depression  Neuromuscular disease    GI/Hepatic negative GI ROS, Neg liver ROS,   Endo/Other  Obesity  Renal/GU negative Renal ROS  negative genitourinary   Musculoskeletal  (+) Arthritis , Rheumatoid disorders,  Fibromyalgia -DJD left knee Hx/o spinal stenosis L4-5 S/P Lumbar microdiscectomy L4-5   Abdominal (+) + obese,   Peds  Hematology negative hematology ROS (+)   Anesthesia Other Findings   Reproductive/Obstetrics                            Anesthesia Physical Anesthesia Plan  ASA: 2  Anesthesia Plan: General   Post-op Pain Management: Regional block   Induction: Intravenous  PONV Risk Score and Plan: 3 and Treatment may vary due to age or medical condition and Propofol infusion  Airway Management Planned: LMA  Additional Equipment:   Intra-op Plan:   Post-operative Plan: Extubation in OR  Informed Consent: I have reviewed the patients History and Physical, chart, labs and discussed the procedure including the risks, benefits and alternatives for the proposed anesthesia with the patient or authorized representative who has indicated his/her understanding and acceptance.     Dental advisory given  Plan Discussed with: CRNA  Anesthesia Plan  Comments: (Patient refuses SAB)      Anesthesia Quick Evaluation

## 2021-05-27 NOTE — Anesthesia Procedure Notes (Addendum)
Anesthesia Regional Block: Adductor canal block   Pre-Anesthetic Checklist: , timeout performed,  Correct Patient, Correct Site, Correct Laterality,  Correct Procedure, Correct Position, site marked,  Risks and benefits discussed,  Surgical consent,  Pre-op evaluation,  At surgeon's request and post-op pain management  Laterality: Lower  Prep: chloraprep       Needles:  Injection technique: Single-shot  Needle Type: Stimiplex     Needle Length: 9cm  Needle Gauge: 21     Additional Needles:   Procedures:,,,, ultrasound used (permanent image in chart),,    Narrative:  Start time: 05/27/2021 12:25 PM End time: 05/27/2021 12:45 PM Injection made incrementally with aspirations every 5 mL.  Performed by: Personally  Anesthesiologist: Nolon Nations, MD  Additional Notes: BP cuff, EKG monitors applied. Sedation begun. Artery and nerve location verified with ultrasound. Anesthetic injected incrementally (63ml), slowly, and after negative aspirations under direct u/s guidance. Good fascial/perineural spread. Tolerated well.

## 2021-05-27 NOTE — Anesthesia Procedure Notes (Signed)
Procedure Name: LMA Insertion Date/Time: 05/27/2021 1:26 PM Performed by: Garrel Ridgel, CRNA Pre-anesthesia Checklist: Patient identified, Emergency Drugs available, Suction available and Patient being monitored Patient Re-evaluated:Patient Re-evaluated prior to induction Oxygen Delivery Method: Circle system utilized Preoxygenation: Pre-oxygenation with 100% oxygen Induction Type: IV induction Ventilation: Mask ventilation without difficulty LMA Size: 4.0 Number of attempts: 1 Placement Confirmation: positive ETCO2 Tube secured with: Tape Dental Injury: Teeth and Oropharynx as per pre-operative assessment

## 2021-05-28 DIAGNOSIS — E669 Obesity, unspecified: Secondary | ICD-10-CM | POA: Diagnosis not present

## 2021-05-28 DIAGNOSIS — Z6831 Body mass index (BMI) 31.0-31.9, adult: Secondary | ICD-10-CM | POA: Diagnosis not present

## 2021-05-28 DIAGNOSIS — Z79899 Other long term (current) drug therapy: Secondary | ICD-10-CM | POA: Diagnosis not present

## 2021-05-28 DIAGNOSIS — M069 Rheumatoid arthritis, unspecified: Secondary | ICD-10-CM | POA: Diagnosis not present

## 2021-05-28 DIAGNOSIS — Z96652 Presence of left artificial knee joint: Secondary | ICD-10-CM | POA: Diagnosis not present

## 2021-05-28 DIAGNOSIS — M797 Fibromyalgia: Secondary | ICD-10-CM | POA: Diagnosis not present

## 2021-05-28 DIAGNOSIS — M1712 Unilateral primary osteoarthritis, left knee: Secondary | ICD-10-CM | POA: Diagnosis not present

## 2021-05-28 DIAGNOSIS — Z87891 Personal history of nicotine dependence: Secondary | ICD-10-CM | POA: Diagnosis not present

## 2021-05-28 DIAGNOSIS — Z7982 Long term (current) use of aspirin: Secondary | ICD-10-CM | POA: Diagnosis not present

## 2021-05-28 LAB — BASIC METABOLIC PANEL
Anion gap: 8 (ref 5–15)
BUN: 12 mg/dL (ref 6–20)
CO2: 25 mmol/L (ref 22–32)
Calcium: 9 mg/dL (ref 8.9–10.3)
Chloride: 102 mmol/L (ref 98–111)
Creatinine, Ser: 0.89 mg/dL (ref 0.44–1.00)
GFR, Estimated: >60 mL/min (ref >60)
Glucose, Bld: 207 mg/dL — ABNORMAL HIGH (ref 70–99)
Potassium: 4 mmol/L (ref 3.5–5.1)
Sodium: 135 mmol/L (ref 135–145)

## 2021-05-28 LAB — CBC
HCT: 37.2 % (ref 36.0–46.0)
Hemoglobin: 12.1 g/dL (ref 12.0–15.0)
MCH: 28.5 pg (ref 26.0–34.0)
MCHC: 32.5 g/dL (ref 30.0–36.0)
MCV: 87.7 fL (ref 80.0–100.0)
Platelets: 203 10*3/uL (ref 150–400)
RBC: 4.24 MIL/uL (ref 3.87–5.11)
RDW: 14 % (ref 11.5–15.5)
WBC: 12.2 10*3/uL — ABNORMAL HIGH (ref 4.0–10.5)
nRBC: 0 % (ref 0.0–0.2)

## 2021-05-28 LAB — GLUCOSE, CAPILLARY
Glucose-Capillary: 118 mg/dL — ABNORMAL HIGH (ref 70–99)
Glucose-Capillary: 145 mg/dL — ABNORMAL HIGH (ref 70–99)

## 2021-05-28 MED ORDER — METHOCARBAMOL 500 MG PO TABS
500.0000 mg | ORAL_TABLET | Freq: Four times a day (QID) | ORAL | Status: DC | PRN
  Administered 2021-05-28: 500 mg via ORAL
  Filled 2021-05-28: qty 1

## 2021-05-28 NOTE — Progress Notes (Signed)
Physical Therapy Treatment Patient Details Name: Heather Douglas MRN: [id number] DOB: 02-07-1961 Today's Date: 05/28/2021   History of Present Illness pt s/p LTKA 05/27/2021 with PMH for fibromylagia, depression and L4-L5 lumbar laminectomy 2015.    PT Comments    Pt progressing toward goals. Will see again later today and pt should be ready to d/c later this pm    Recommendations for follow up therapy are one component of a multi-disciplinary discharge planning process, led by the attending physician.  Recommendations may be updated based on patient status, additional functional criteria and insurance authorization.  Follow Up Recommendations  Follow physician's recommendations for discharge plan and follow up therapies     Assistance Recommended at Discharge Frequent or constant Supervision/Assistance  Patient can return home with the following A little help with walking and/or transfers;Help with stairs or ramp for entrance   Equipment Recommendations  Rolling walker (2 wheels)    Recommendations for Other Services       Precautions / Restrictions Precautions Precautions: Knee;Fall Precaution Comments: reviewed importance of terminal knee extension, no pillow under knee with pt and significant other Required Braces or Orthoses: Knee Immobilizer - Left Restrictions Weight Bearing Restrictions: No Other Position/Activity Restrictions: WBAT     Mobility  Bed Mobility Overal bed mobility: Needs Assistance Bed Mobility: Supine to Sit     Supine to sit: Min guard     General bed mobility comments: pt able to self assist LLE with gait belt    Transfers Overall transfer level: Needs assistance Equipment used: Rolling walker (2 wheels) Transfers: Sit to/from Stand Sit to Stand: Min assist           General transfer comment: cues for hand placement and safety, to back up to surface and continue use of RW until ready to reach back    Ambulation/Gait Ambulation/Gait  assistance: Min assist Gait Distance (Feet): 80 Feet Assistive device: Rolling walker (2 wheels) Gait Pattern/deviations: Step-to pattern       General Gait Details: cues for sequence and RW safety   Stairs             Wheelchair Mobility    Modified Rankin (Stroke Patients Only)       Balance                                            Cognition Arousal/Alertness: Awake/alert Behavior During Therapy: Impulsive Overall Cognitive Status: Within Functional Limits for tasks assessed                                 General Comments: mildly impulsive, requiring cues for redirection and safety        Exercises Total Joint Exercises Ankle Circles/Pumps: AROM;10 reps;Both Quad Sets: AROM;10 reps;Both Heel Slides: AAROM;Both;10 reps;AROM Straight Leg Raises: AROM;AAROM;Strengthening;Right;10 reps    General Comments        Pertinent Vitals/Pain Pain Assessment: 0-10 Pain Location: L knee Pain Descriptors / Indicators: Aching;Sore Pain Intervention(s): Limited activity within patient's tolerance;Monitored during session;Repositioned    Home Living                          Prior Function            PT Goals (current goals can now be found in the care  plan section) Acute Rehab PT Goals Patient Stated Goal: I want to get back to moving better and with my grandbaby PT Goal Formulation: With patient Time For Goal Achievement: 06/10/21 Potential to Achieve Goals: Good Progress towards PT goals: Progressing toward goals    Frequency    7X/week      PT Plan Current plan remains appropriate    Co-evaluation              AM-PAC PT "6 Clicks" Mobility   Outcome Measure  Help needed turning from your back to your side while in a flat bed without using bedrails?: A Little Help needed moving from lying on your back to sitting on the side of a flat bed without using bedrails?: A Little Help needed moving to  and from a bed to a chair (including a wheelchair)?: A Little Help needed standing up from a chair using your arms (e.g., wheelchair or bedside chair)?: A Little Help needed to walk in hospital room?: A Little Help needed climbing 3-5 steps with a railing? : A Little 6 Click Score: 18    End of Session Equipment Utilized During Treatment: Gait belt Activity Tolerance: Patient tolerated treatment well Patient left: with call bell/phone within reach;in bed;with bed alarm set;with family/visitor present Nurse Communication: Mobility status PT Visit Diagnosis: Other abnormalities of gait and mobility (R26.89);Muscle weakness (generalized) (M62.81)     Time: 8867-7373 PT Time Calculation (min) (ACUTE ONLY): 34 min  Charges:  $Gait Training: 8-22 mins $Therapeutic Exercise: 8-22 mins                     Baxter Flattery, PT  Acute Rehab Dept ([place]) [phone] Pager [phone]  05/28/2021    [facility] 05/28/2021, 1:53 PM

## 2021-05-28 NOTE — Anesthesia Postprocedure Evaluation (Signed)
Anesthesia Post Note  Patient: Heather Douglas  Procedure(s) Performed: TOTAL KNEE ARTHROPLASTY (Left: Knee)     Patient location during evaluation: PACU Anesthesia Type: Spinal Level of consciousness: awake and alert and oriented Pain management: pain level controlled Vital Signs Assessment: post-procedure vital signs reviewed and stable Respiratory status: spontaneous breathing, nonlabored ventilation and respiratory function stable Cardiovascular status: blood pressure returned to baseline Postop Assessment: no apparent nausea or vomiting, spinal receding, no headache and no backache Anesthetic complications: no   No notable events documented.         Marthenia Rolling

## 2021-05-28 NOTE — Plan of Care (Signed)
  Problem: Education: Goal: Knowledge of the prescribed therapeutic regimen will improve Outcome: Progressing   Problem: Activity: Goal: Ability to avoid complications of mobility impairment will improve Outcome: Progressing   Problem: Pain Management: Goal: Pain level will decrease with appropriate interventions Outcome: Progressing   

## 2021-05-28 NOTE — Plan of Care (Signed)
Problem: Education: Goal: Knowledge of the prescribed therapeutic regimen will improve 05/28/2021 1547 by Olen Cordial, RN Outcome: Completed/Met 05/28/2021 0715 by Olen Cordial, RN Outcome: Progressing   Problem: Pain Management: Goal: Pain level will decrease with appropriate interventions 05/28/2021 1547 by Olen Cordial, RN Outcome: Completed/Met 05/28/2021 0715 by Olen Cordial, RN Outcome: Progressing   Problem: Education: Goal: Knowledge of General Education information will improve Description: Including pain rating scale, medication(s)/side effects and non-pharmacologic comfort measures 05/28/2021 1547 by Olen Cordial, RN Outcome: Completed/Met 05/28/2021 0715 by Olen Cordial, RN Outcome: Progressing

## 2021-05-28 NOTE — Progress Notes (Signed)
Subjective: 1 Day Post-Op Procedure(s) (LRB): TOTAL KNEE ARTHROPLASTY (Left) Patient reports pain as 5 on 0-10 scale.   Denies CP or SOB.  Voiding without difficulty. Positive flatus. Objective: Vital signs in last 24 hours: Temp:  [97.5 F (36.4 C)-98.9 F (37.2 C)] 98.8 F (37.1 C) (01/10 0511) Pulse Rate:  [73-98] 86 (01/10 0511) Resp:  [10-20] 18 (01/10 0511) BP: (96-152)/(50-82) 105/53 (01/10 0511) SpO2:  [93 %-100 %] 96 % (01/10 0511) Weight:  [90.3 kg] 90.3 kg (01/09 1020)  Intake/Output from previous day: 01/09 0701 - 01/10 0700 In: 3112 [P.O.:650; I.V.:2262; IV Piggyback:200] Out: 485 [Urine:445; Blood:40] Intake/Output this shift: No intake/output data recorded.  Recent Labs    05/28/21 0311  HGB 12.1   Recent Labs    05/28/21 0311  WBC 12.2*  RBC 4.24  HCT 37.2  PLT 203   Recent Labs    05/28/21 0311  NA 135  K 4.0  CL 102  CO2 25  BUN 12  CREATININE 0.89  GLUCOSE 207*  CALCIUM 9.0   No results for input(s): LABPT, INR in the last 72 hours.  Neurologically intact Intact pulses distally Dorsiflexion/Plantar flexion intact Incision: dressing C/D/I Compartment soft  Assessment/Plan:  1 Day Post-Op Procedure(s) (LRB): TOTAL KNEE ARTHROPLASTY (Left) Advance diet Up with therapy D/C IV fluids Discharge home with home health  Patient's repeat UA preoperatively was negative.  Clear.  No white cells or leukocyte esterase.  This was a catheter specimen as opposed to a clean-catch.  The patient had no symptoms of dysuria or frequency.  She has none today.  We will therefore discontinue the oral antibiotics.  Patient will undergo physical therapy and if is doing well will discharge to home.  We discussed elevation.  Ice.   Principal Problem:   Left knee DJD      Johnn Hai 05/28/2021, @[facility] 

## 2021-05-28 NOTE — Op Note (Signed)
Heather Douglas MEDICAL RECORD NO: [id number] ACCOUNT NO: [id number] DATE OF BIRTH: Sep 02, 1960 FACILITY: [facility]: [id number] PHYSICIAN: Johnn Hai, MD  Operative Report   DATE OF PROCEDURE: 05/27/2021   PREOPERATIVE DIAGNOSIS:  End-stage osteoarthrosis, left knee.  POSTOPERATIVE DIAGNOSIS:  End-stage osteoarthrosis, left knee.  PROCEDURE PERFORMED:  Left total knee arthroplasty utilizing DePuy Attune rotating platform 5 femur, 4 tibia, 35 patella.  ANESTHESIA:  Spinal.    ASSISTANT:  Lacie Draft, PA   5 mm insert.  HISTORY:  A 61 year old with severe osteoarthrosis medial compartment of left knee indicated for replacement with degenerative joint.  Risks and benefits discussed including bleeding, infection, damage to neurovascular structures.  No change in symptoms,  worsening symptoms, DVT, PE, anesthetic complications, etc.  TECHNIQUE:  The patient in supine position.  After induction of adequate general anesthesia and Kefzol for antimicrobial prophylaxis as well as ciprofloxacin, the left lower extremity was prepped and draped and exsanguinated in the usual sterile fashion.   Thigh tourniquet inflated to 225 mmHg.  Midline incision was then made in the skin.  Full thickness flaps were developed.  Median parapatellar arthrotomy was performed.  Patella was everted, knee flexed.  Soft tissue elevated medially, preserving the  MCL.  Severe osteoarthrosis noted of the medial compartment bone-on-bone.  Moderately severe in the patellofemoral joint relatively preserved laterally.  Fat pad was debrided.  Remnants of medial and lateral menisci were then removed.  A notch was placed  above the femoral notch and used as a starting point for a drill to enter the femoral canal.  This was performed, irrigated T-handle placed and then an intramedullary guide, 5-degree left lying off the distal femur.  This was pinned and a distal femoral  cut was then performed.  It was sized off the  anterior femur to a 5.  This is pinned in.  Three degrees of external rotation.  Distal femoral cutting block #5 was then placed.  I performed the anterior, posterior chamfer cuts protecting soft tissues at  all times.  I did not notch the femur.  We then subluxed the tibia.  Remnants of medial and lateral menisci removed.  The defect was posteromedially. Placed our external guide.  45mm off the medial the defect bisecting the tibiotalar joint parallel to the shaft 3-degree  slope.  This was pinned and I performed a proximal tibial cut.  We then checked an extension block, which was satisfactory with a 5-6 insert and the flexion gap was satisfactory.   Next, I then flexed the knee, subluxed the tibia.  We measured the tibia to a 4.  First 5, then a 4.  Maximizing the coverage just medial aspect of the tibial tubercle.  This was pinned.  I harvested bone centrally and impacted into the distal femur.  We  then drilled centrally, placed our punch guide.  I then turned attention to the femur.  We used a box cut jig.  This was centralized into the canal.  Pinned.  I performed a box cut without difficulty.  We then placed a trial 5 femur.  We drilled our lug  holes.  I placed a 5 mm insert, reduced it and had full extension, full flexion, good stability to varus valgus stressing at 0 and 30 degrees, negative anterior drawer.  I then prepared the patella was measured to a 21 plain to a 14 utilizing a patellar  jig.  Placed a 53, then a 35 patella medializing this.  This was then placed,  drilled our peg holes.  I then placed a trial patella and reduced it, had excellent patellofemoral tracking.  All trials were then removed.  We then checked posteriorly.   Remnants of the menisci were removed.  Popliteus and the capsule was intact.  A bare rongeur was utilized to remove an osteophyte off the posterior condyles.  Following this, I used pulsatile lavage to clean the joint.  Mixed cement back table under  vacuum in the  appropriate fashion.  I then flexed the knee, subluxed the tibia, dried all surfaces thoroughly.  I then placed cement into the tibial canal, digitally pressurizing it.  This was placed in the proximal tibial plateau and on the tibial  component.  This was then impacted into place with redundant cement removed.  I then cemented and impacted the femur, redundant cement removed.  Cement was placed on the femur as well.  I placed a 5 insert, reduced it, held with axial load throughout the  curing of the cement.  Cemented and clamped the patella.  All redundant cement removed.  0.25% Marcaine with epinephrine was infiltrated into the joint and allowed to set while the cement was curing.  After 58 minutes, tourniquet was deflated after  curing of the cement.  Any minor bleeding was cauterized.  Following this, I checked flexion, extension and stability with varus and valgus stressing and it was satisfactory.  I then flexed the knee, removed the trial insert and meticulously removed all  redundant cement.  I used pulsatile lavage to clean the wound followed by IrriSept, followed by pulsatile lavage.  We then flexed the knee and placed a 5 permanent insert into the joint.  This was then reduced.  I had full extension, full flexion, good  stability to varus valgus stressing at 0 and 30 degrees, negative anterior drawer.  Following that, I reapproximated the patellar arthrotomy with the knee in slight flexion.  Reapproximated the patellar arthrotomy with #1 Vicryl in interrupted  figure-of-eight sutures, followed by running Stratafix for oversewing.  After this had excellent patellofemoral tracking.  Full flexion, full extension and good stability.  Negative anterior drawer.  Copiously irrigated the subcutaneous tissue,  subcutaneous with 2-0 and skin with subcuticular Monocryl.  She had flexion to gravity at 90 degrees.  Following this, a sterile dressing applied, placed in immobilizer and transported to the recovery  room in satisfactory condition.  The patient tolerated the procedure well.  No complication.    ASSISTANT:  Lacie Draft, PA   ESTIMATED BLOOD LOSS: 40 mL.  Assistant was needed throughout the case for patient positioning, exposure, closure and retractors.     SUJ D: 05/27/2021 3:40:22 pm T: 05/28/2021 12:50:00 am  JOB: [id number]/ [id number]

## 2021-05-28 NOTE — Progress Notes (Signed)
05/28/21 1500  PT Visit Information  Last PT Received On 05/28/21  Pt progressing, meeting goals. Significant other providing needed assist, able to return demonstrate areas noted below; reviewed donning/doffing KI with pt and caregiver. Reviewed use of ice, reviewed s/l with L knee in terminal knee extension and use of KI if needed; pt ready to d/c with assist as noted.   Assistance Needed +1  History of Present Illness pt s/p LTKA 05/27/2021 with PMH for fibromylagia, depression and L4-L5 lumbar laminectomy 2015.  Subjective Data  Patient Stated Goal I want to get back to moving better and with my grandbaby  Precautions  Precautions Knee;Fall  Precaution Comments reviewed importance of terminal knee extension, no pillow under knee with pt and significant other  Required Braces or Orthoses Knee Immobilizer - Left  Restrictions  Other Position/Activity Restrictions WBAT  Pain Assessment  Pain Assessment 0-10  Pain Score 4  Pain Location L knee  Pain Descriptors / Indicators Aching;Sore  Pain Intervention(s) Limited activity within patient's tolerance;Monitored during session;Premedicated before session;Repositioned (declined ice)  Cognition  Arousal/Alertness Awake/alert  Behavior During Therapy Impulsive  Overall Cognitive Status Within Functional Limits for tasks assessed  General Comments mildly impulsive, requiring cues for redirection and safety  Bed Mobility  Overal bed mobility Needs Assistance  Bed Mobility Supine to Sit;Sit to [employer] guard  Supine to sit Min guard  General bed mobility comments pt able to self assist LLE with gait belt for supine to sit  Transfers  Overall transfer level Needs assistance  Equipment used Rolling walker (2 wheels)  Transfers Sit to/from Stand  Sit to Stand Min guard  General transfer comment cues for hand placement and safety, to back up to surface and continue use of RW until ready to reach back  Ambulation/Gait   Ambulation/Gait assistance Min guard;Min assist  Gait Distance (Feet) 60 Feet  Assistive device Rolling walker (2 wheels)  Gait Pattern/deviations Step-to pattern;Decreased stance time - left  General Gait Details cues for sequence and RW safety; signficant other able to return demo  needed assist  Stairs Yes  Stairs assistance Min assist;Min guard  Stair Management One rail Right;One rail Left;Step to pattern;Forwards;With crutches  Number of Stairs 5 (x2)  General stair comments cues for sequence, position of crutch; significant other able to return demo assist needed and cue correctly  Total Joint Exercises  Ankle Circles/Pumps AROM;10 reps;Both  PT - End of Session  Equipment Utilized During Treatment Gait belt  Activity Tolerance Patient tolerated treatment well  Patient left with call bell/phone within reach;in bed;with bed alarm set;with family/visitor present  Nurse Communication Mobility status   PT - Assessment/Plan  PT Plan Current plan remains appropriate  PT Visit Diagnosis Other abnormalities of gait and mobility (R26.89);Muscle weakness (generalized) (M62.81)  PT Frequency (ACUTE ONLY) 7X/week  Follow Up Recommendations Follow physician's recommendations for discharge plan and follow up therapies  Assistance recommended at discharge Frequent or constant Supervision/Assistance  Patient can return home with the following A little help with walking and/or transfers;Help with stairs or ramp for entrance  PT equipment Rolling walker (2 wheels)  AM-PAC PT "6 Clicks" Mobility Outcome Measure (Version 2)  Help needed turning from your back to your side while in a flat bed without using bedrails? 3  Help needed moving from lying on your back to sitting on the side of a flat bed without using bedrails? 3  Help needed moving to and from a bed to a chair (  including a wheelchair)? 3  Help needed standing up from a chair using your arms (e.g., wheelchair or bedside chair)? 3  Help  needed to walk in hospital room? 3  Help needed climbing 3-5 steps with a railing?  3  6 Click Score 18  Consider Recommendation of Discharge To: Home with [facility]  PT Goal Progression  Progress towards PT goals Progressing toward goals  Acute Rehab PT Goals  PT Goal Formulation With patient  Time For Goal Achievement 06/10/21  Potential to Achieve Goals Good  PT Time Calculation  PT Start Time (ACUTE ONLY) 1432  PT Stop Time (ACUTE ONLY) 1508  PT Time Calculation (min) (ACUTE ONLY) 36 min  PT General Charges  $$ ACUTE PT VISIT 1 Visit  PT Treatments  $Gait Training 23-37 mins

## 2021-05-28 NOTE — TOC Transition Note (Signed)
Transition of Care [facility]) - CM/SW Discharge Note  Patient Details  Name: Heather Douglas MRN: [id number] Date of Birth: Apr 10, 1961  [facility] CM/SW Contact:  Sherie Don, LCSW Phone Number: 05/28/2021, 11:21 AM  Clinical Narrative: Patient is expected to discharge home after working with PT. CSW met with patient to confirm discharge plan. Per patient, she is unsure what the PT plan will be after discharge. Patient has a 3N1, but will need a rolling walker. [employer] delivered rolling walker to patient's room. TOC signing off.    Final next level of care: Home/Self Care Barriers to Discharge: No Barriers Identified  Patient Goals and CMS Choice Patient states their goals for this hospitalization and ongoing recovery are:: Return home CMS Medicare.gov Compare Post Acute Care list provided to:: Patient Choice offered to / list presented to : Patient  Discharge Plan and Services        DME Arranged: Walker rolling DME Agency: [employer] Date DME Agency Contacted: 05/28/21 Representative spoke with at DME Agency: Ovid Curd  Readmission Risk Interventions No flowsheet data found.

## 2021-05-29 DIAGNOSIS — E785 Hyperlipidemia, unspecified: Secondary | ICD-10-CM | POA: Diagnosis not present

## 2021-05-29 DIAGNOSIS — R32 Unspecified urinary incontinence: Secondary | ICD-10-CM | POA: Diagnosis not present

## 2021-05-29 DIAGNOSIS — Z7982 Long term (current) use of aspirin: Secondary | ICD-10-CM | POA: Diagnosis not present

## 2021-05-29 DIAGNOSIS — Z471 Aftercare following joint replacement surgery: Secondary | ICD-10-CM | POA: Diagnosis not present

## 2021-05-29 DIAGNOSIS — M797 Fibromyalgia: Secondary | ICD-10-CM | POA: Diagnosis not present

## 2021-05-29 DIAGNOSIS — Z8542 Personal history of malignant neoplasm of other parts of uterus: Secondary | ICD-10-CM | POA: Diagnosis not present

## 2021-05-29 DIAGNOSIS — M069 Rheumatoid arthritis, unspecified: Secondary | ICD-10-CM | POA: Diagnosis not present

## 2021-05-29 DIAGNOSIS — G8929 Other chronic pain: Secondary | ICD-10-CM | POA: Diagnosis not present

## 2021-05-29 DIAGNOSIS — Z96652 Presence of left artificial knee joint: Secondary | ICD-10-CM | POA: Diagnosis not present

## 2021-05-29 DIAGNOSIS — F32A Depression, unspecified: Secondary | ICD-10-CM | POA: Diagnosis not present

## 2021-05-29 DIAGNOSIS — I340 Nonrheumatic mitral (valve) insufficiency: Secondary | ICD-10-CM | POA: Diagnosis not present

## 2021-05-29 DIAGNOSIS — F419 Anxiety disorder, unspecified: Secondary | ICD-10-CM | POA: Diagnosis not present

## 2021-05-29 DIAGNOSIS — H269 Unspecified cataract: Secondary | ICD-10-CM | POA: Diagnosis not present

## 2021-05-31 DIAGNOSIS — H269 Unspecified cataract: Secondary | ICD-10-CM | POA: Diagnosis not present

## 2021-05-31 DIAGNOSIS — F419 Anxiety disorder, unspecified: Secondary | ICD-10-CM | POA: Diagnosis not present

## 2021-05-31 DIAGNOSIS — Z7982 Long term (current) use of aspirin: Secondary | ICD-10-CM | POA: Diagnosis not present

## 2021-05-31 DIAGNOSIS — Z471 Aftercare following joint replacement surgery: Secondary | ICD-10-CM | POA: Diagnosis not present

## 2021-05-31 DIAGNOSIS — Z96652 Presence of left artificial knee joint: Secondary | ICD-10-CM | POA: Diagnosis not present

## 2021-05-31 DIAGNOSIS — G8929 Other chronic pain: Secondary | ICD-10-CM | POA: Diagnosis not present

## 2021-05-31 DIAGNOSIS — Z8542 Personal history of malignant neoplasm of other parts of uterus: Secondary | ICD-10-CM | POA: Diagnosis not present

## 2021-05-31 DIAGNOSIS — M797 Fibromyalgia: Secondary | ICD-10-CM | POA: Diagnosis not present

## 2021-05-31 DIAGNOSIS — I340 Nonrheumatic mitral (valve) insufficiency: Secondary | ICD-10-CM | POA: Diagnosis not present

## 2021-05-31 DIAGNOSIS — R32 Unspecified urinary incontinence: Secondary | ICD-10-CM | POA: Diagnosis not present

## 2021-05-31 DIAGNOSIS — M069 Rheumatoid arthritis, unspecified: Secondary | ICD-10-CM | POA: Diagnosis not present

## 2021-05-31 DIAGNOSIS — E785 Hyperlipidemia, unspecified: Secondary | ICD-10-CM | POA: Diagnosis not present

## 2021-05-31 DIAGNOSIS — F32A Depression, unspecified: Secondary | ICD-10-CM | POA: Diagnosis not present

## 2021-06-01 DIAGNOSIS — F32A Depression, unspecified: Secondary | ICD-10-CM | POA: Diagnosis not present

## 2021-06-01 DIAGNOSIS — G8929 Other chronic pain: Secondary | ICD-10-CM | POA: Diagnosis not present

## 2021-06-01 DIAGNOSIS — M797 Fibromyalgia: Secondary | ICD-10-CM | POA: Diagnosis not present

## 2021-06-01 DIAGNOSIS — Z7982 Long term (current) use of aspirin: Secondary | ICD-10-CM | POA: Diagnosis not present

## 2021-06-01 DIAGNOSIS — Z8542 Personal history of malignant neoplasm of other parts of uterus: Secondary | ICD-10-CM | POA: Diagnosis not present

## 2021-06-01 DIAGNOSIS — M069 Rheumatoid arthritis, unspecified: Secondary | ICD-10-CM | POA: Diagnosis not present

## 2021-06-01 DIAGNOSIS — Z471 Aftercare following joint replacement surgery: Secondary | ICD-10-CM | POA: Diagnosis not present

## 2021-06-01 DIAGNOSIS — E785 Hyperlipidemia, unspecified: Secondary | ICD-10-CM | POA: Diagnosis not present

## 2021-06-01 DIAGNOSIS — I340 Nonrheumatic mitral (valve) insufficiency: Secondary | ICD-10-CM | POA: Diagnosis not present

## 2021-06-01 DIAGNOSIS — F419 Anxiety disorder, unspecified: Secondary | ICD-10-CM | POA: Diagnosis not present

## 2021-06-01 DIAGNOSIS — R32 Unspecified urinary incontinence: Secondary | ICD-10-CM | POA: Diagnosis not present

## 2021-06-01 DIAGNOSIS — Z96652 Presence of left artificial knee joint: Secondary | ICD-10-CM | POA: Diagnosis not present

## 2021-06-01 DIAGNOSIS — H269 Unspecified cataract: Secondary | ICD-10-CM | POA: Diagnosis not present

## 2021-06-03 ENCOUNTER — Encounter (HOSPITAL_COMMUNITY): Payer: Self-pay | Admitting: Specialist

## 2021-06-03 DIAGNOSIS — M797 Fibromyalgia: Secondary | ICD-10-CM | POA: Diagnosis not present

## 2021-06-03 DIAGNOSIS — Z96652 Presence of left artificial knee joint: Secondary | ICD-10-CM | POA: Diagnosis not present

## 2021-06-03 DIAGNOSIS — I340 Nonrheumatic mitral (valve) insufficiency: Secondary | ICD-10-CM | POA: Diagnosis not present

## 2021-06-03 DIAGNOSIS — Z7982 Long term (current) use of aspirin: Secondary | ICD-10-CM | POA: Diagnosis not present

## 2021-06-03 DIAGNOSIS — E785 Hyperlipidemia, unspecified: Secondary | ICD-10-CM | POA: Diagnosis not present

## 2021-06-03 DIAGNOSIS — H269 Unspecified cataract: Secondary | ICD-10-CM | POA: Diagnosis not present

## 2021-06-03 DIAGNOSIS — G8929 Other chronic pain: Secondary | ICD-10-CM | POA: Diagnosis not present

## 2021-06-03 DIAGNOSIS — F419 Anxiety disorder, unspecified: Secondary | ICD-10-CM | POA: Diagnosis not present

## 2021-06-03 DIAGNOSIS — Z8542 Personal history of malignant neoplasm of other parts of uterus: Secondary | ICD-10-CM | POA: Diagnosis not present

## 2021-06-03 DIAGNOSIS — F32A Depression, unspecified: Secondary | ICD-10-CM | POA: Diagnosis not present

## 2021-06-03 DIAGNOSIS — M069 Rheumatoid arthritis, unspecified: Secondary | ICD-10-CM | POA: Diagnosis not present

## 2021-06-03 DIAGNOSIS — Z471 Aftercare following joint replacement surgery: Secondary | ICD-10-CM | POA: Diagnosis not present

## 2021-06-03 DIAGNOSIS — R32 Unspecified urinary incontinence: Secondary | ICD-10-CM | POA: Diagnosis not present

## 2021-06-05 DIAGNOSIS — G8929 Other chronic pain: Secondary | ICD-10-CM | POA: Diagnosis not present

## 2021-06-05 DIAGNOSIS — Z96652 Presence of left artificial knee joint: Secondary | ICD-10-CM | POA: Diagnosis not present

## 2021-06-05 DIAGNOSIS — M069 Rheumatoid arthritis, unspecified: Secondary | ICD-10-CM | POA: Diagnosis not present

## 2021-06-05 DIAGNOSIS — Z8542 Personal history of malignant neoplasm of other parts of uterus: Secondary | ICD-10-CM | POA: Diagnosis not present

## 2021-06-05 DIAGNOSIS — F419 Anxiety disorder, unspecified: Secondary | ICD-10-CM | POA: Diagnosis not present

## 2021-06-05 DIAGNOSIS — E785 Hyperlipidemia, unspecified: Secondary | ICD-10-CM | POA: Diagnosis not present

## 2021-06-05 DIAGNOSIS — H269 Unspecified cataract: Secondary | ICD-10-CM | POA: Diagnosis not present

## 2021-06-05 DIAGNOSIS — M797 Fibromyalgia: Secondary | ICD-10-CM | POA: Diagnosis not present

## 2021-06-05 DIAGNOSIS — I340 Nonrheumatic mitral (valve) insufficiency: Secondary | ICD-10-CM | POA: Diagnosis not present

## 2021-06-05 DIAGNOSIS — R32 Unspecified urinary incontinence: Secondary | ICD-10-CM | POA: Diagnosis not present

## 2021-06-05 DIAGNOSIS — Z471 Aftercare following joint replacement surgery: Secondary | ICD-10-CM | POA: Diagnosis not present

## 2021-06-05 DIAGNOSIS — Z7982 Long term (current) use of aspirin: Secondary | ICD-10-CM | POA: Diagnosis not present

## 2021-06-05 DIAGNOSIS — F32A Depression, unspecified: Secondary | ICD-10-CM | POA: Diagnosis not present

## 2021-06-07 DIAGNOSIS — H269 Unspecified cataract: Secondary | ICD-10-CM | POA: Diagnosis not present

## 2021-06-07 DIAGNOSIS — M797 Fibromyalgia: Secondary | ICD-10-CM | POA: Diagnosis not present

## 2021-06-07 DIAGNOSIS — F32A Depression, unspecified: Secondary | ICD-10-CM | POA: Diagnosis not present

## 2021-06-07 DIAGNOSIS — Z7982 Long term (current) use of aspirin: Secondary | ICD-10-CM | POA: Diagnosis not present

## 2021-06-07 DIAGNOSIS — Z96652 Presence of left artificial knee joint: Secondary | ICD-10-CM | POA: Diagnosis not present

## 2021-06-07 DIAGNOSIS — Z8542 Personal history of malignant neoplasm of other parts of uterus: Secondary | ICD-10-CM | POA: Diagnosis not present

## 2021-06-07 DIAGNOSIS — R32 Unspecified urinary incontinence: Secondary | ICD-10-CM | POA: Diagnosis not present

## 2021-06-07 DIAGNOSIS — Z471 Aftercare following joint replacement surgery: Secondary | ICD-10-CM | POA: Diagnosis not present

## 2021-06-07 DIAGNOSIS — F419 Anxiety disorder, unspecified: Secondary | ICD-10-CM | POA: Diagnosis not present

## 2021-06-07 DIAGNOSIS — G8929 Other chronic pain: Secondary | ICD-10-CM | POA: Diagnosis not present

## 2021-06-07 DIAGNOSIS — M069 Rheumatoid arthritis, unspecified: Secondary | ICD-10-CM | POA: Diagnosis not present

## 2021-06-07 DIAGNOSIS — I340 Nonrheumatic mitral (valve) insufficiency: Secondary | ICD-10-CM | POA: Diagnosis not present

## 2021-06-07 DIAGNOSIS — E785 Hyperlipidemia, unspecified: Secondary | ICD-10-CM | POA: Diagnosis not present

## 2021-06-10 DIAGNOSIS — H269 Unspecified cataract: Secondary | ICD-10-CM | POA: Diagnosis not present

## 2021-06-10 DIAGNOSIS — I340 Nonrheumatic mitral (valve) insufficiency: Secondary | ICD-10-CM | POA: Diagnosis not present

## 2021-06-10 DIAGNOSIS — Z7982 Long term (current) use of aspirin: Secondary | ICD-10-CM | POA: Diagnosis not present

## 2021-06-10 DIAGNOSIS — F32A Depression, unspecified: Secondary | ICD-10-CM | POA: Diagnosis not present

## 2021-06-10 DIAGNOSIS — Z8542 Personal history of malignant neoplasm of other parts of uterus: Secondary | ICD-10-CM | POA: Diagnosis not present

## 2021-06-10 DIAGNOSIS — F419 Anxiety disorder, unspecified: Secondary | ICD-10-CM | POA: Diagnosis not present

## 2021-06-10 DIAGNOSIS — M797 Fibromyalgia: Secondary | ICD-10-CM | POA: Diagnosis not present

## 2021-06-10 DIAGNOSIS — Z96652 Presence of left artificial knee joint: Secondary | ICD-10-CM | POA: Diagnosis not present

## 2021-06-10 DIAGNOSIS — G8929 Other chronic pain: Secondary | ICD-10-CM | POA: Diagnosis not present

## 2021-06-10 DIAGNOSIS — Z471 Aftercare following joint replacement surgery: Secondary | ICD-10-CM | POA: Diagnosis not present

## 2021-06-10 DIAGNOSIS — M069 Rheumatoid arthritis, unspecified: Secondary | ICD-10-CM | POA: Diagnosis not present

## 2021-06-10 DIAGNOSIS — R32 Unspecified urinary incontinence: Secondary | ICD-10-CM | POA: Diagnosis not present

## 2021-06-10 DIAGNOSIS — E785 Hyperlipidemia, unspecified: Secondary | ICD-10-CM | POA: Diagnosis not present

## 2021-06-11 DIAGNOSIS — Z471 Aftercare following joint replacement surgery: Secondary | ICD-10-CM | POA: Diagnosis not present

## 2021-06-11 DIAGNOSIS — Z96652 Presence of left artificial knee joint: Secondary | ICD-10-CM | POA: Diagnosis not present

## 2021-06-12 DIAGNOSIS — E785 Hyperlipidemia, unspecified: Secondary | ICD-10-CM | POA: Diagnosis not present

## 2021-06-12 DIAGNOSIS — Z471 Aftercare following joint replacement surgery: Secondary | ICD-10-CM | POA: Diagnosis not present

## 2021-06-12 DIAGNOSIS — M797 Fibromyalgia: Secondary | ICD-10-CM | POA: Diagnosis not present

## 2021-06-12 DIAGNOSIS — I340 Nonrheumatic mitral (valve) insufficiency: Secondary | ICD-10-CM | POA: Diagnosis not present

## 2021-06-12 DIAGNOSIS — H269 Unspecified cataract: Secondary | ICD-10-CM | POA: Diagnosis not present

## 2021-06-12 DIAGNOSIS — Z96652 Presence of left artificial knee joint: Secondary | ICD-10-CM | POA: Diagnosis not present

## 2021-06-12 DIAGNOSIS — F32A Depression, unspecified: Secondary | ICD-10-CM | POA: Diagnosis not present

## 2021-06-12 DIAGNOSIS — R32 Unspecified urinary incontinence: Secondary | ICD-10-CM | POA: Diagnosis not present

## 2021-06-12 DIAGNOSIS — Z7982 Long term (current) use of aspirin: Secondary | ICD-10-CM | POA: Diagnosis not present

## 2021-06-12 DIAGNOSIS — G8929 Other chronic pain: Secondary | ICD-10-CM | POA: Diagnosis not present

## 2021-06-12 DIAGNOSIS — Z8542 Personal history of malignant neoplasm of other parts of uterus: Secondary | ICD-10-CM | POA: Diagnosis not present

## 2021-06-12 DIAGNOSIS — F419 Anxiety disorder, unspecified: Secondary | ICD-10-CM | POA: Diagnosis not present

## 2021-06-12 DIAGNOSIS — M069 Rheumatoid arthritis, unspecified: Secondary | ICD-10-CM | POA: Diagnosis not present

## 2021-06-14 DIAGNOSIS — H269 Unspecified cataract: Secondary | ICD-10-CM | POA: Diagnosis not present

## 2021-06-14 DIAGNOSIS — M797 Fibromyalgia: Secondary | ICD-10-CM | POA: Diagnosis not present

## 2021-06-14 DIAGNOSIS — M069 Rheumatoid arthritis, unspecified: Secondary | ICD-10-CM | POA: Diagnosis not present

## 2021-06-14 DIAGNOSIS — Z7982 Long term (current) use of aspirin: Secondary | ICD-10-CM | POA: Diagnosis not present

## 2021-06-14 DIAGNOSIS — Z471 Aftercare following joint replacement surgery: Secondary | ICD-10-CM | POA: Diagnosis not present

## 2021-06-14 DIAGNOSIS — G8929 Other chronic pain: Secondary | ICD-10-CM | POA: Diagnosis not present

## 2021-06-14 DIAGNOSIS — I340 Nonrheumatic mitral (valve) insufficiency: Secondary | ICD-10-CM | POA: Diagnosis not present

## 2021-06-14 DIAGNOSIS — Z96652 Presence of left artificial knee joint: Secondary | ICD-10-CM | POA: Diagnosis not present

## 2021-06-14 DIAGNOSIS — E785 Hyperlipidemia, unspecified: Secondary | ICD-10-CM | POA: Diagnosis not present

## 2021-06-14 DIAGNOSIS — Z8542 Personal history of malignant neoplasm of other parts of uterus: Secondary | ICD-10-CM | POA: Diagnosis not present

## 2021-06-14 DIAGNOSIS — R32 Unspecified urinary incontinence: Secondary | ICD-10-CM | POA: Diagnosis not present

## 2021-06-14 DIAGNOSIS — F32A Depression, unspecified: Secondary | ICD-10-CM | POA: Diagnosis not present

## 2021-06-14 DIAGNOSIS — F419 Anxiety disorder, unspecified: Secondary | ICD-10-CM | POA: Diagnosis not present

## 2021-06-19 ENCOUNTER — Telehealth: Payer: Self-pay | Admitting: Acute Care

## 2021-06-19 NOTE — Telephone Encounter (Signed)
Routing to Anita to make aware.  

## 2021-06-20 NOTE — Telephone Encounter (Signed)
I am not sure why [place] with [employer] has called about this I already knew the CT was approved

## 2021-06-25 ENCOUNTER — Other Ambulatory Visit: Payer: Self-pay | Admitting: Radiology

## 2021-06-25 ENCOUNTER — Ambulatory Visit: Payer: BC Managed Care – PPO

## 2021-06-25 DIAGNOSIS — M25562 Pain in left knee: Secondary | ICD-10-CM | POA: Diagnosis not present

## 2021-07-03 DIAGNOSIS — M25562 Pain in left knee: Secondary | ICD-10-CM | POA: Diagnosis not present

## 2021-07-09 DIAGNOSIS — M25562 Pain in left knee: Secondary | ICD-10-CM | POA: Diagnosis not present

## 2021-07-12 DIAGNOSIS — M25562 Pain in left knee: Secondary | ICD-10-CM | POA: Diagnosis not present

## 2021-07-17 DIAGNOSIS — M25562 Pain in left knee: Secondary | ICD-10-CM | POA: Diagnosis not present

## 2021-07-19 DIAGNOSIS — M25562 Pain in left knee: Secondary | ICD-10-CM | POA: Diagnosis not present

## 2021-07-23 DIAGNOSIS — M25562 Pain in left knee: Secondary | ICD-10-CM | POA: Diagnosis not present

## 2021-07-30 DIAGNOSIS — M25562 Pain in left knee: Secondary | ICD-10-CM | POA: Diagnosis not present

## 2021-08-06 DIAGNOSIS — M25562 Pain in left knee: Secondary | ICD-10-CM | POA: Diagnosis not present

## 2021-08-09 DIAGNOSIS — M25562 Pain in left knee: Secondary | ICD-10-CM | POA: Diagnosis not present

## 2021-08-21 DIAGNOSIS — M5459 Other low back pain: Secondary | ICD-10-CM | POA: Diagnosis not present

## 2021-10-01 DIAGNOSIS — M5416 Radiculopathy, lumbar region: Secondary | ICD-10-CM | POA: Diagnosis not present

## 2021-10-23 DIAGNOSIS — F410 Panic disorder [episodic paroxysmal anxiety] without agoraphobia: Secondary | ICD-10-CM | POA: Diagnosis not present

## 2021-10-23 DIAGNOSIS — F3342 Major depressive disorder, recurrent, in full remission: Secondary | ICD-10-CM | POA: Diagnosis not present

## 2021-10-29 DIAGNOSIS — M5451 Vertebrogenic low back pain: Secondary | ICD-10-CM | POA: Diagnosis not present

## 2021-12-03 DIAGNOSIS — M47816 Spondylosis without myelopathy or radiculopathy, lumbar region: Secondary | ICD-10-CM | POA: Diagnosis not present

## 2022-04-22 ENCOUNTER — Telehealth: Payer: Self-pay

## 2022-04-22 NOTE — Patient Outreach (Signed)
  Care Coordination   04/22/2022 Name: Beverley Allender MRN: [id number] DOB: 05-Apr-1961   Care Coordination Outreach Attempts:  An unsuccessful telephone outreach was attempted today to offer the patient information about available care coordination services as a benefit of their health plan.   Follow Up Plan:  Additional outreach attempts will be made to offer the patient care coordination information and services.   Encounter Outcome:  No Answer   Care Coordination Interventions:  No, not indicated    Jone Baseman, RN, MSN [facility] Care Management Coordinator Direct Line [phone]

## 2022-05-14 ENCOUNTER — Telehealth: Payer: Self-pay

## 2022-05-14 NOTE — Patient Outreach (Signed)
  Care Coordination   05/14/2022 Name: Heather Douglas MRN: [id number] DOB: May 19, 1961   Care Coordination Outreach Attempts:  A second unsuccessful outreach was attempted today to offer the patient with information about available care coordination services as a benefit of their health plan.     Follow Up Plan:  Additional outreach attempts will be made to offer the patient care coordination information and services.   Encounter Outcome:  No Answer   Care Coordination Interventions:  No, not indicated    Jone Baseman, RN, MSN [place] [facility] Care Management Coordinator Direct Line [phone]

## 2022-05-28 ENCOUNTER — Telehealth: Payer: Self-pay

## 2022-05-28 NOTE — Patient Outreach (Signed)
  Care Coordination   05/28/2022 Name: Heather Douglas MRN: [id number] DOB: 1960-06-30   Care Coordination Outreach Attempts:  A third unsuccessful outreach was attempted today to offer the patient with information about available care coordination services as a benefit of their health plan.   Follow Up Plan:  No further outreach attempts will be made at this time. We have been unable to contact the patient to offer or enroll patient in care coordination services  Encounter Outcome:  No Answer   Care Coordination Interventions:  No, not indicated    Jone Baseman, RN, MSN [place] Care Management Coordinator Direct Line [phone]

## 2022-07-29 DIAGNOSIS — F410 Panic disorder [episodic paroxysmal anxiety] without agoraphobia: Secondary | ICD-10-CM | POA: Diagnosis not present

## 2022-07-29 DIAGNOSIS — F3342 Major depressive disorder, recurrent, in full remission: Secondary | ICD-10-CM | POA: Diagnosis not present

## 2022-07-30 IMAGING — US US THYROID
1 series · 13 of 25 positions shown · non-contrast
Comparison: Prior CT scan of the chest 05/06/2018

CLINICAL DATA: Incidental on CT.

EXAM:
THYROID ULTRASOUND
TECHNIQUE: Ultrasound examination of the thyroid gland and adjacent soft
tissues was performed.

[Series 1: us thyroid · 0.08mm/px · 13 of 34 slices shown]
[im 1/34]
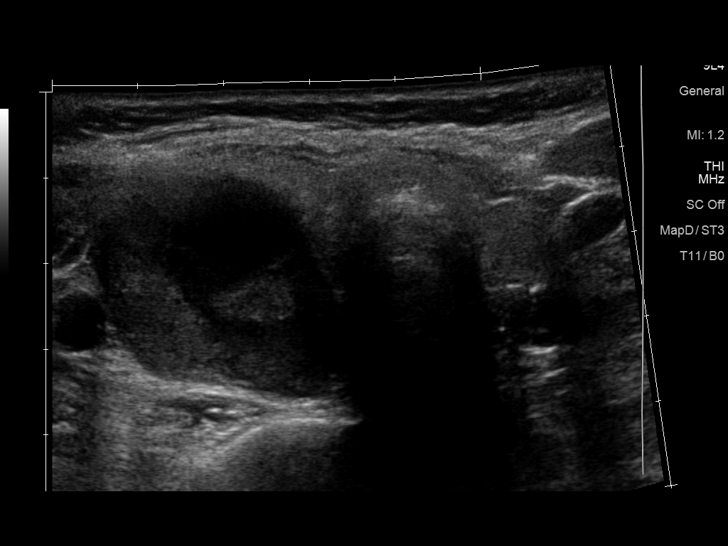
[im 3/34]
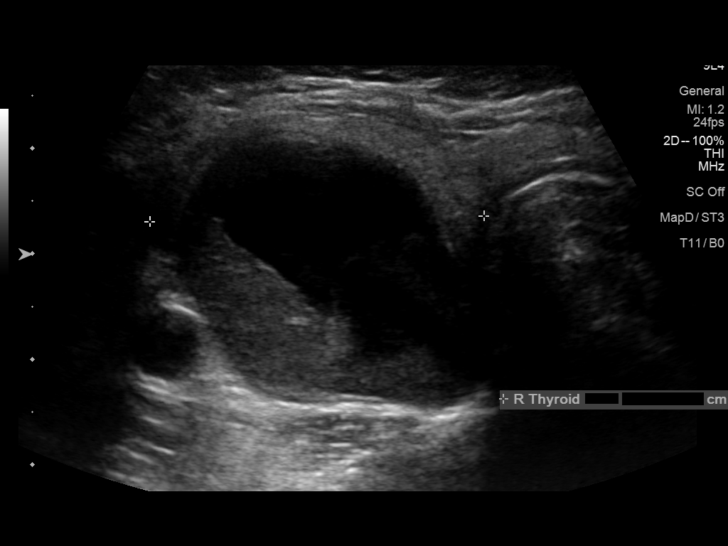
[im 6/34]
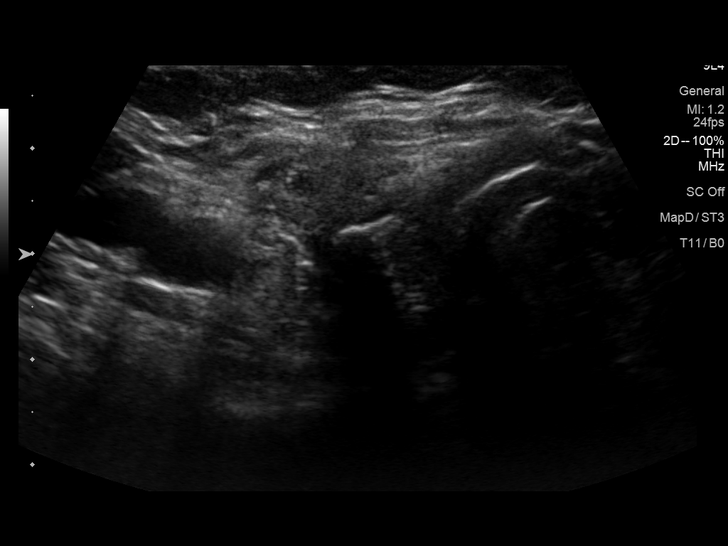
[im 9/34]
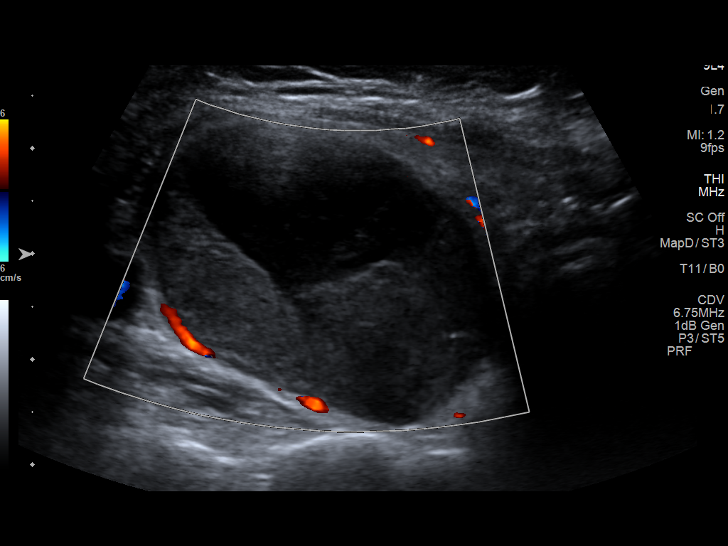
[im 12/34]
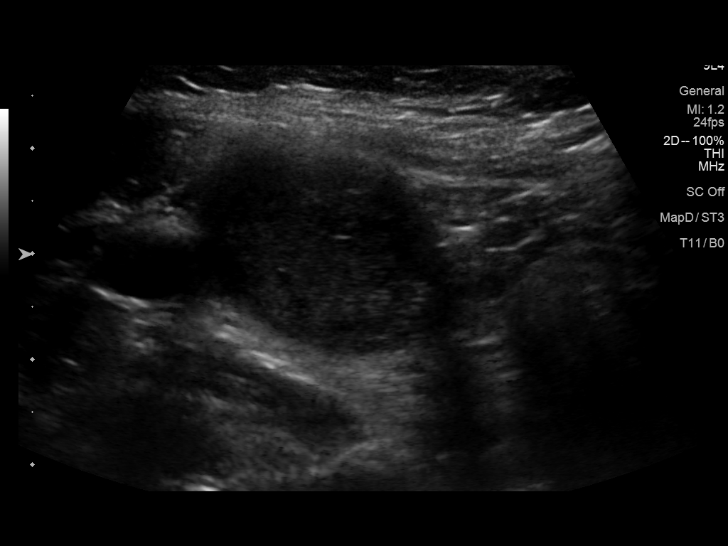
[im 14/34]
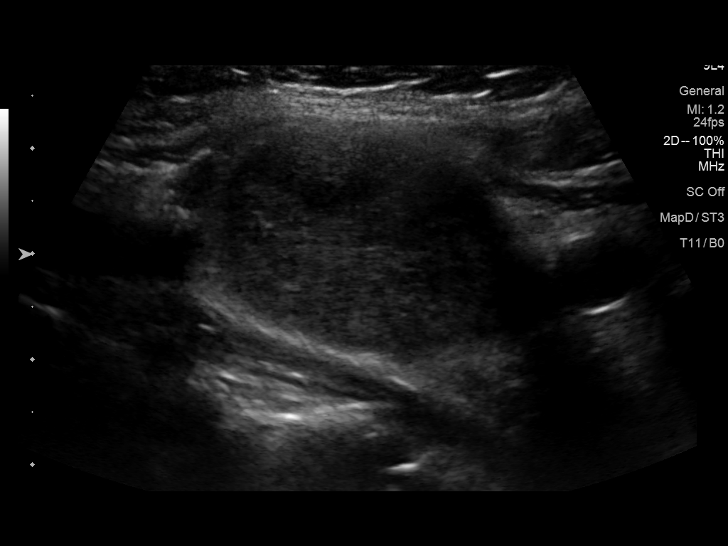
[im 17/34]
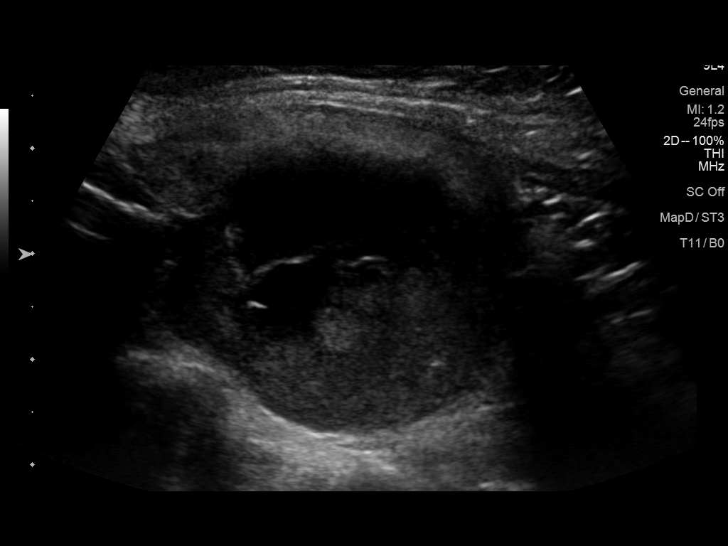
[im 20/34]
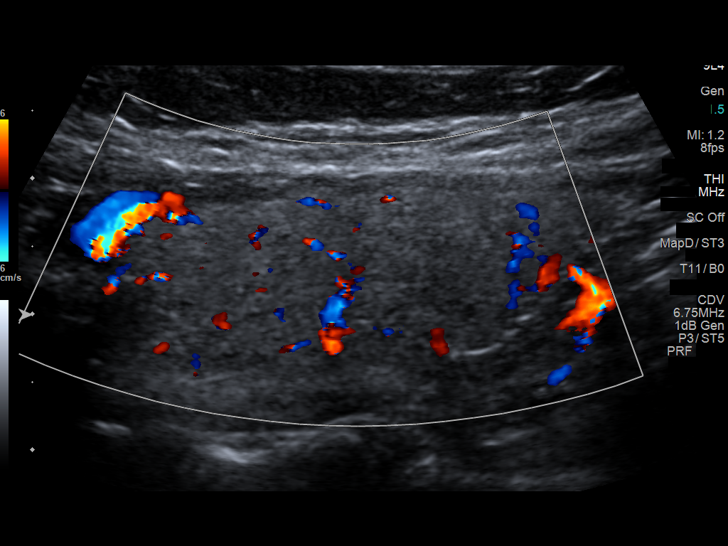
[im 23/34]
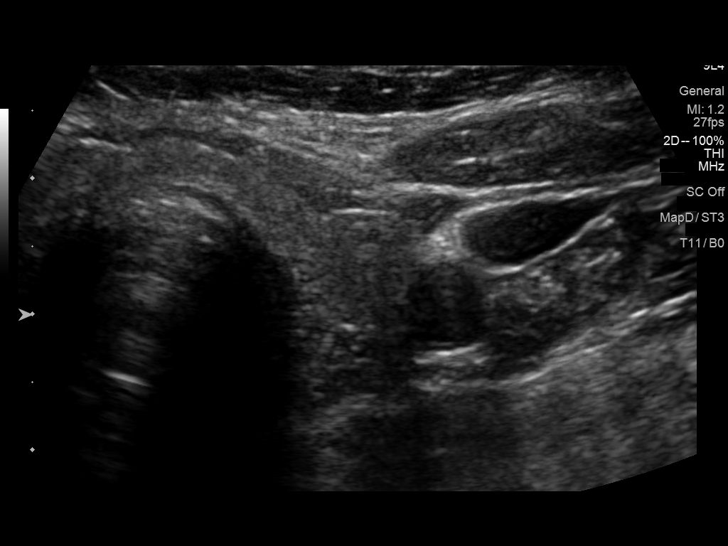
[im 25/34]
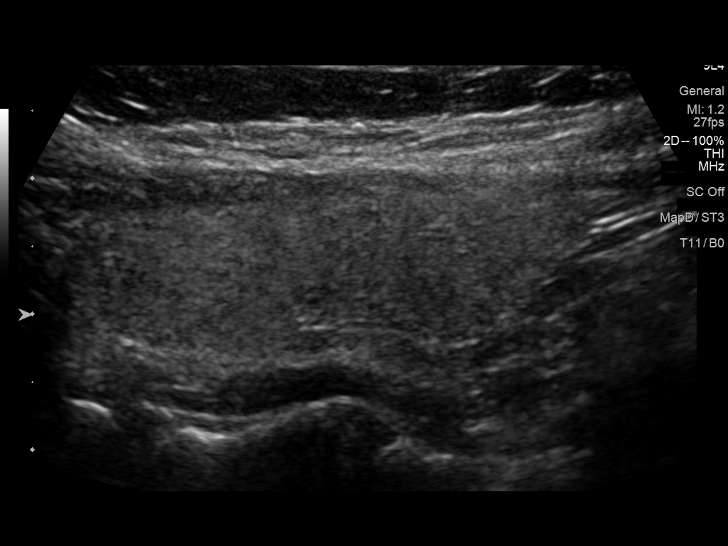
[im 28/34]
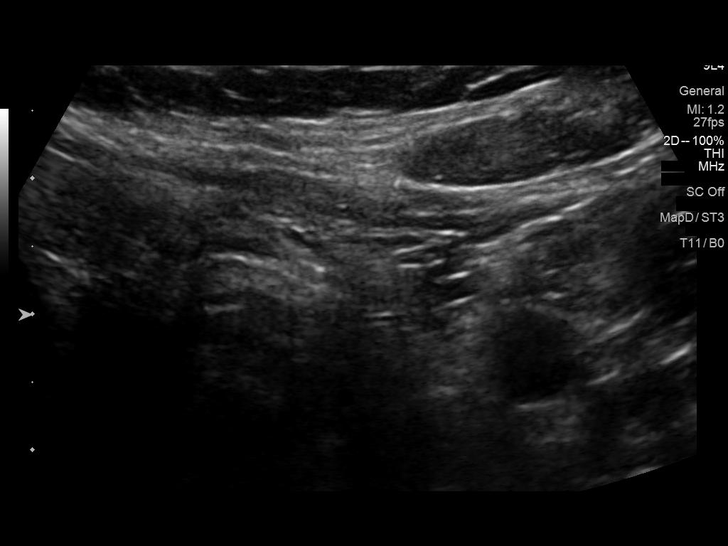
[im 31/34]
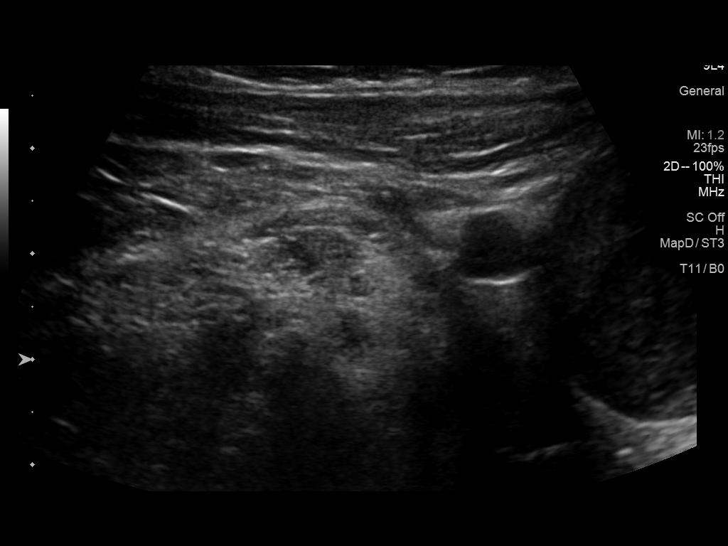
[im 34/34]
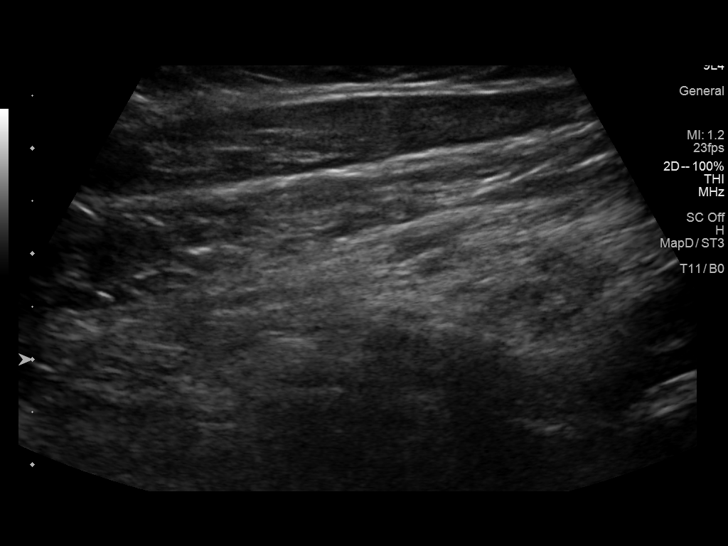

[13 of 25 positions shown; findings below may reference images not displayed]

FINDINGS: Parenchymal Echotexture: Mildly heterogenous

Isthmus: 0.4 cm

Right lobe: 4.6 x 2.9 x 3.2 cm

Left lobe: 4.0 x 1.4 x 1.3 cm

_________________________________________________________

Estimated total number of nodules >/= 1 cm: 1

Number of spongiform nodules >/=  2 cm not described below (TR1): 0

Number of mixed cystic and solid nodules >/= 1.5 cm not described
below (TR2): 0

_________________________________________________________

Nodule # 1:

Location: Right; Inferior

Maximum size: 3.7 cm; Other 2 dimensions: 3.0 x 2.6 cm

Composition: mixed cystic and solid (1)

Echogenicity: hypoechoic (2)

Shape: not taller-than-wide (0)

Margins: smooth (0)

Echogenic foci: none (0)

ACR TI-RADS total points: 3.

ACR TI-RADS risk category: TR3 (3 points).

ACR TI-RADS recommendations:

**Given size (>/= 2.5 cm) and appearance, fine needle aspiration of
this mildly suspicious nodule should be considered based on TI-RADS
criteria.

_________________________________________________________
IMPRESSION: Approximately 3.7 cm TI-RADS category 3 nodule in the right inferior
gland meets criteria to consider fine-needle aspiration biopsy.

The above is in keeping with the ACR TI-RADS recommendations - [HOSPITAL] 7781;[DATE].

## 2022-10-29 IMAGING — US US FNA BIOPSY THYROID 1ST LESION
1 series · 13 of 20 positions shown · non-contrast
Comparison: US Thyroid 03/13/20

MEDICATIONS:
9 cc 1% lidocaine

COMPLICATIONS:
None immediate.

INDICATION: Indeterminate thyroid nodule

Right Inferior thyroid nodule
3.7 cm
EXAM:
ULTRASOUND GUIDED FINE NEEDLE ASPIRATION OF INDETERMINATE THYROID
NODULE
TECHNIQUE: Informed written consent was obtained from the patient after a
discussion of the risks, benefits and alternatives to treatment.
Questions regarding the procedure were encouraged and answered. A
timeout was performed prior to the initiation of the procedure.

[Series 1: us fna biopsy thyroid 1st lesion · 0.06mm/px · 20 acquisitions, 13 frames shown]
[im 1/20]
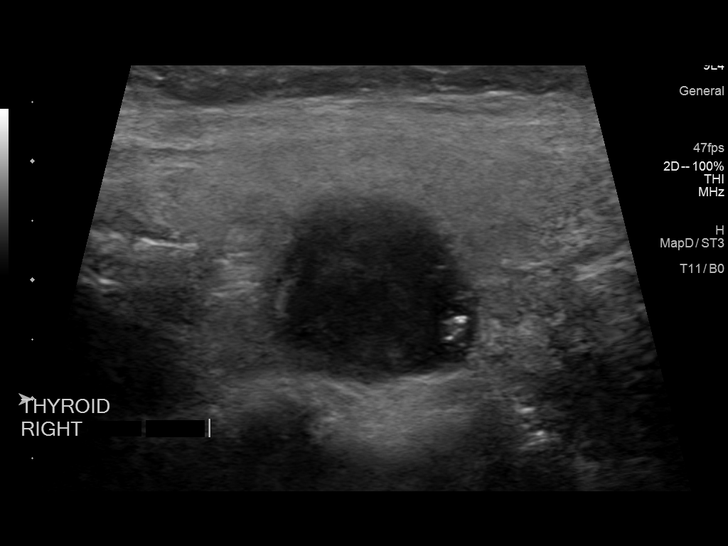
[im 3/20]
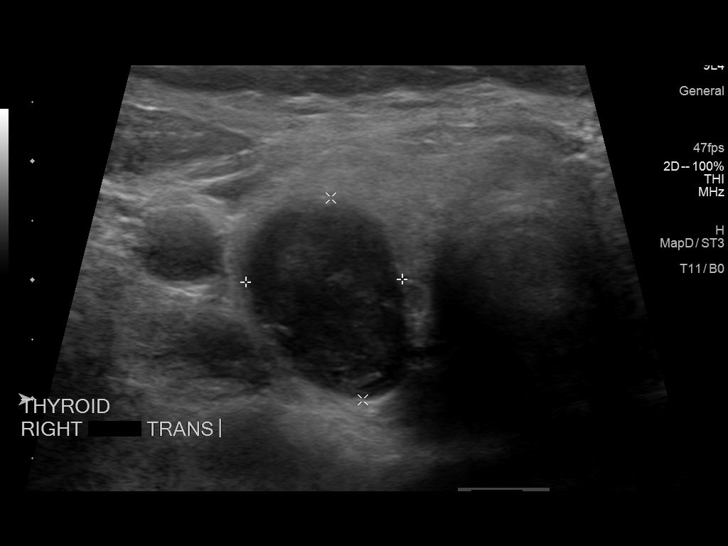
[im 4/20]
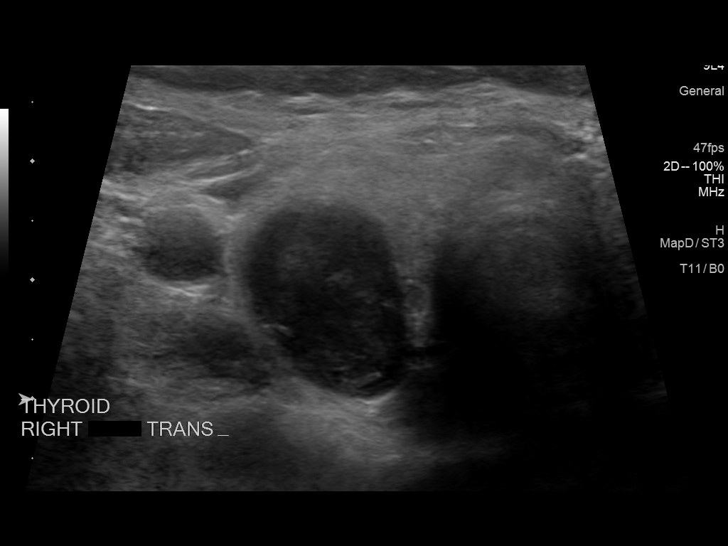
[im 6/20]
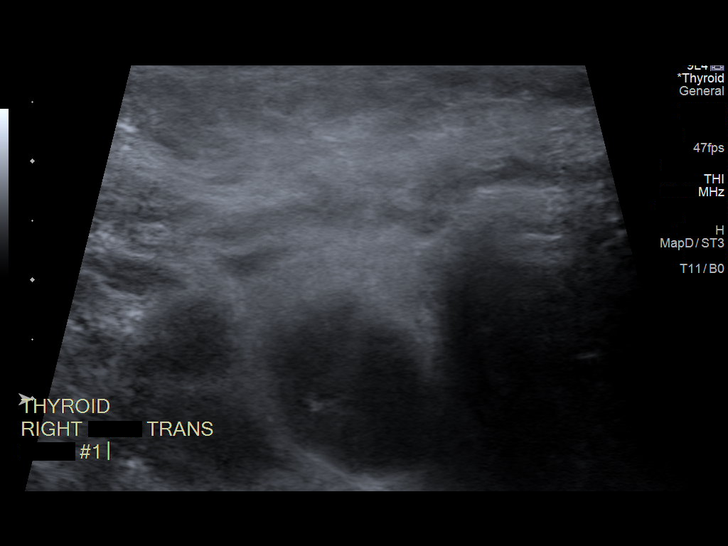
[im 7/20]
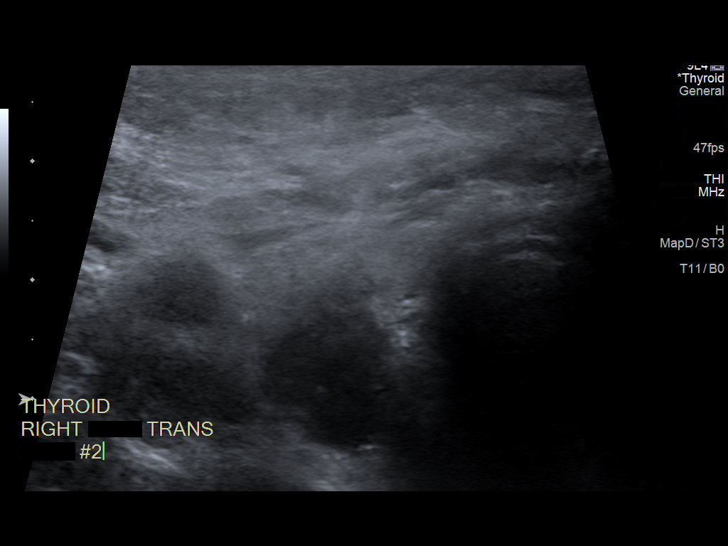
[im 9/20]
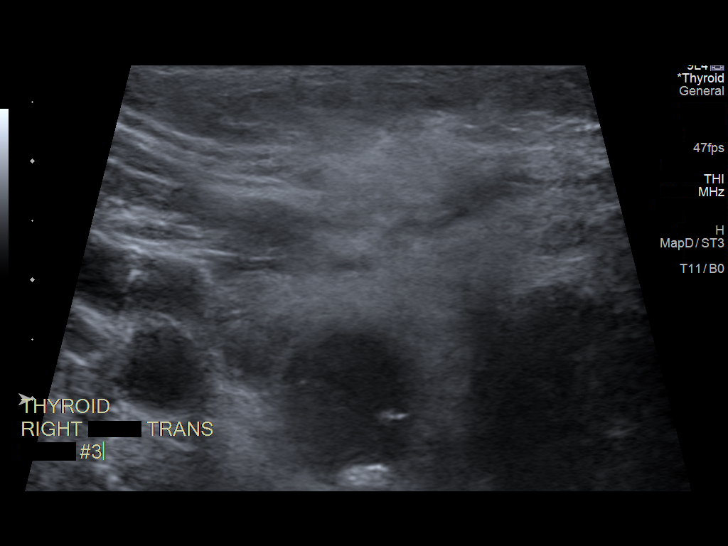
[im 11/20]
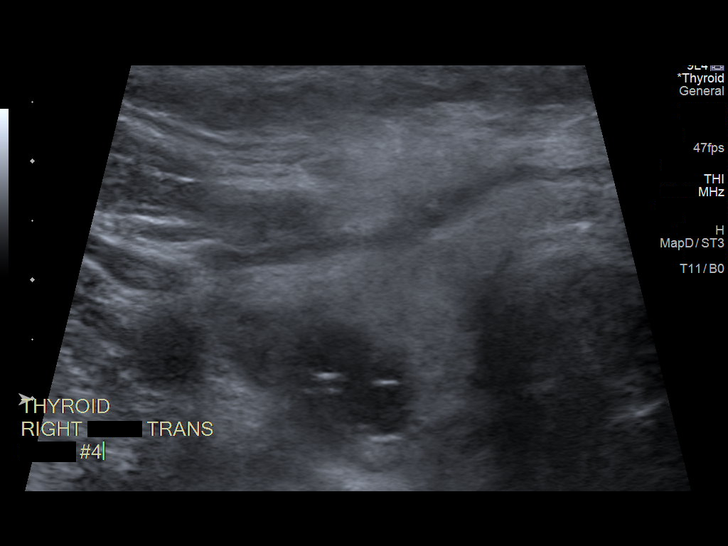
[im 12/20]
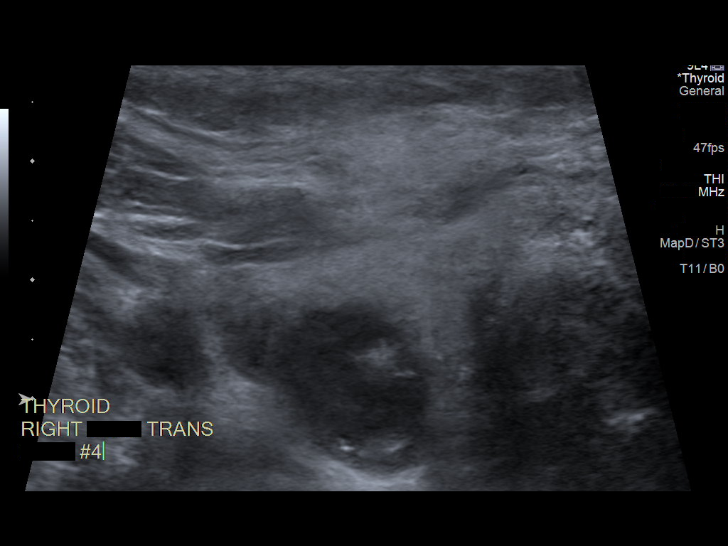
[im 14/20]
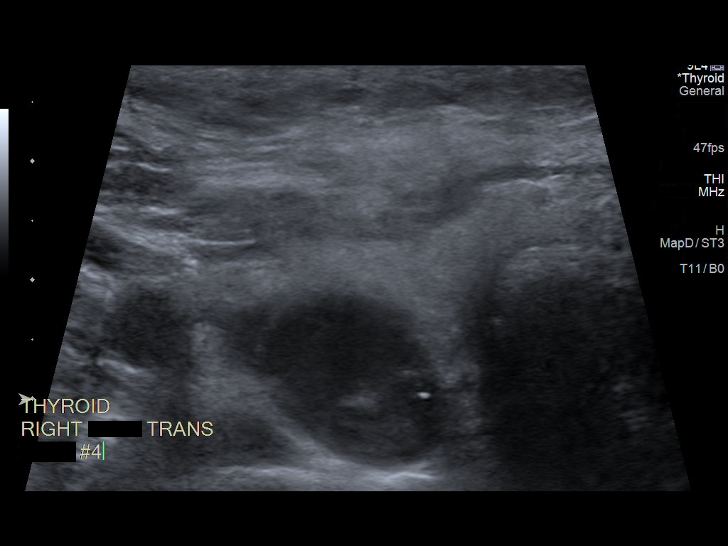
[im 15/20]
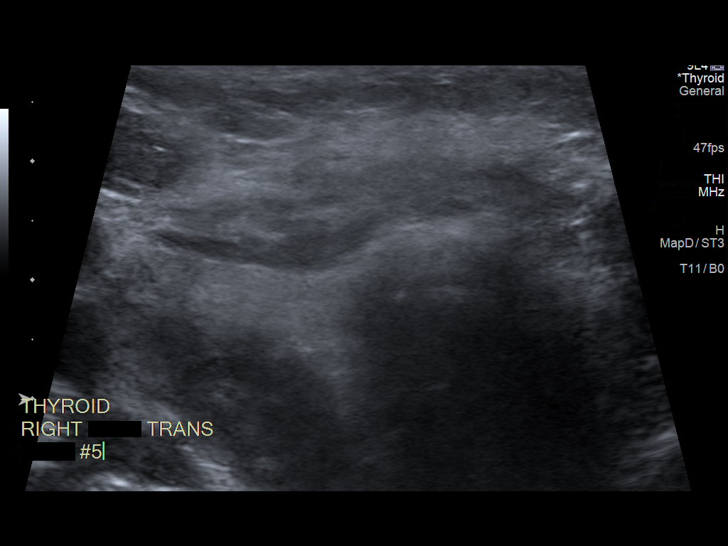
[im 17/20]
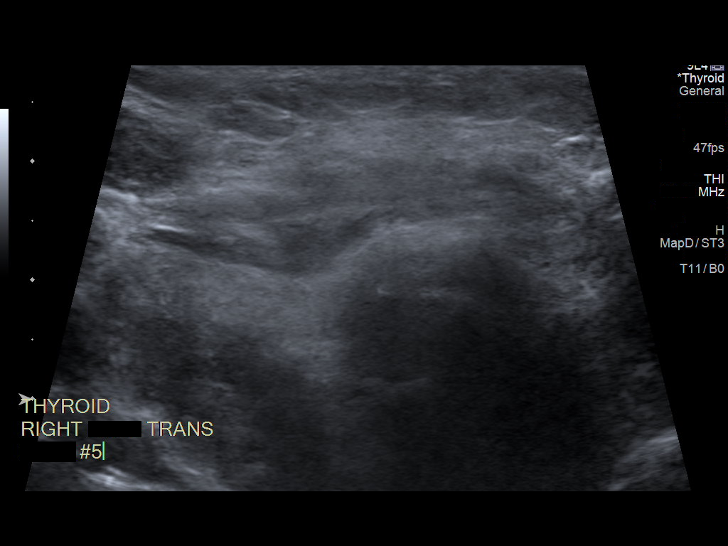
[im 18/20]
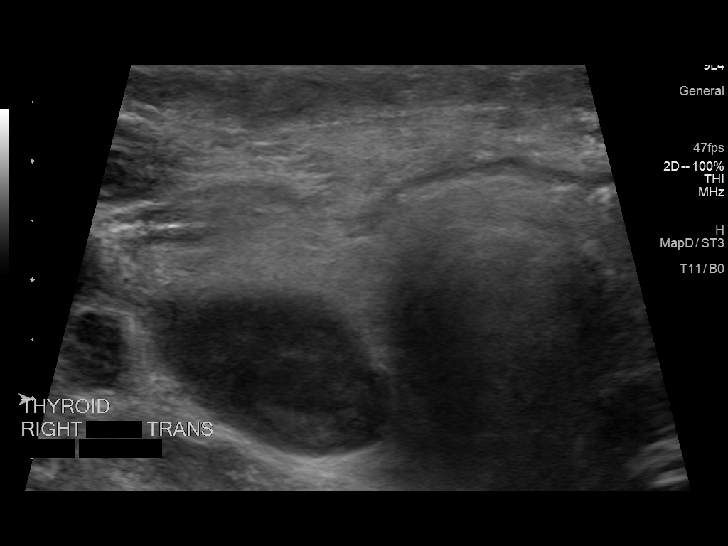
[im 20/20]
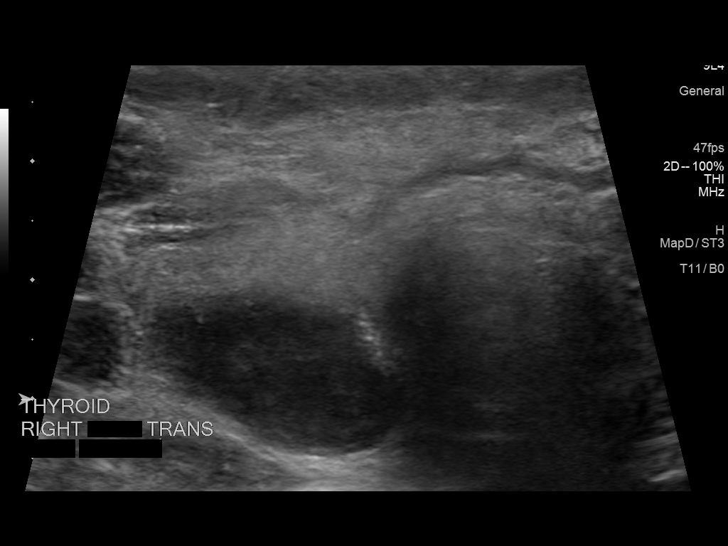

[13 of 20 positions shown; findings below may reference images not displayed]

Pre-procedural ultrasound scanning demonstrated unchanged size and
appearance of the indeterminate nodule within the right thyroid

The procedure was planned. The neck was prepped in the usual sterile
fashion, and a sterile drape was applied covering the operative
field. A timeout was performed prior to the initiation of the
procedure. Local anesthesia was provided with 1% lidocaine.

Under direct ultrasound guidance, 5 FNA biopsies were performed of
the right inferior thyroid nodule with a 25 gauge needle.

2 of these samples were obtained for AFIRMA

Multiple ultrasound images were saved for procedural documentation
purposes. The samples were prepared and submitted to pathology.

Limited post procedural scanning was negative for hematoma or
additional complication. Dressings were placed. The patient
tolerated the above procedures procedure well without immediate
postprocedural complication.
FINDINGS: Nodule reference number based on prior diagnostic ultrasound: 1

Maximum size: 3.7 cm

Location: Right; Inferior

ACR TI-RADS risk category: TR3 (3 points)

Reason for biopsy: meets ACR TI-RADS criteria

Ultrasound imaging confirms appropriate placement of the needles
within the thyroid nodule.
IMPRESSION: Technically successful ultrasound guided fine needle aspiration of
right inferior thyroid nodule

Read by

Shevon Razo

## 2022-12-02 ENCOUNTER — Ambulatory Visit (INDEPENDENT_AMBULATORY_CARE_PROVIDER_SITE_OTHER): Payer: BC Managed Care – PPO | Admitting: Family Medicine

## 2022-12-02 ENCOUNTER — Encounter: Payer: Self-pay | Admitting: Family Medicine

## 2022-12-02 VITALS — BP 94/68 | HR 91 | Temp 98.5°F | Ht 66.0 in | Wt 203.5 lb

## 2022-12-02 DIAGNOSIS — M0579 Rheumatoid arthritis with rheumatoid factor of multiple sites without organ or systems involvement: Secondary | ICD-10-CM | POA: Diagnosis not present

## 2022-12-02 DIAGNOSIS — J449 Chronic obstructive pulmonary disease, unspecified: Secondary | ICD-10-CM | POA: Insufficient documentation

## 2022-12-02 DIAGNOSIS — F17201 Nicotine dependence, unspecified, in remission: Secondary | ICD-10-CM | POA: Diagnosis not present

## 2022-12-02 DIAGNOSIS — R739 Hyperglycemia, unspecified: Secondary | ICD-10-CM

## 2022-12-02 DIAGNOSIS — R5383 Other fatigue: Secondary | ICD-10-CM

## 2022-12-02 DIAGNOSIS — G894 Chronic pain syndrome: Secondary | ICD-10-CM

## 2022-12-02 DIAGNOSIS — E782 Mixed hyperlipidemia: Secondary | ICD-10-CM

## 2022-12-02 DIAGNOSIS — J432 Centrilobular emphysema: Secondary | ICD-10-CM

## 2022-12-02 MED ORDER — BREZTRI AEROSPHERE 160-9-4.8 MCG/ACT IN AERO: 2.0000 | INHALATION_SPRAY | Freq: Two times a day (BID) | RESPIRATORY_TRACT | 5 refills | Status: DC

## 2022-12-02 MED ORDER — ALBUTEROL SULFATE HFA 108 (90 BASE) MCG/ACT IN AERS: 2.0000 | INHALATION_SPRAY | Freq: Four times a day (QID) | RESPIRATORY_TRACT | 5 refills | Status: DC | PRN

## 2022-12-02 MED ORDER — DICLOFENAC SODIUM 75 MG PO TBEC: 75.0000 mg | DELAYED_RELEASE_TABLET | Freq: Two times a day (BID) | ORAL | 5 refills | Status: DC

## 2022-12-02 NOTE — Progress Notes (Unsigned)
New Patient Office Visit  Subjective    Patient ID: Heather Douglas, female    DOB: 21-Oct-1960  Age: 62 y.o. MRN: [id number]  CC:  Chief Complaint  Patient presents with   Establish Care    HPI [name] presents to establish care Patient is here to establish. She reports a history of chronic OA, possible RA, just had a knee was replaced in January 2023, states that it sort of helped, but she has a lot of trouble with stiffness of her muscles. Patient reports a long history of chronic pain, especially from her joints, pretty much all joints in her body. States that she does not have rheumatoid arthritis, however I reviewed her past labs with her and she has positive ANA and positive Rheumatoid factor on 2 separate occasions.  States she has had 1 injection done in the past by Dr. Ethelene Hal in 2023 in her back which didn't seem to help. I reviewed notes in the chart from Dr. Shelle Iron and Dr. Ethelene Hal.   Reports she has been seen by pulmonary for a chronic cough, is a former smoker, had LDCT done which showed emphysema. I reviewed this finding with the patient while discussing her respiratory symptoms.   Pt reports she had a TAH BSO in 2013 due to uterine cancer. She reports that since then she has bad reactions to insect bites in the past.   Patient has a history of chronic depression/ anxiety. States that she is seeing specialist for this, Dr. Evelene Croon who writes her medications for her. Reports her symptoms are relatively controlled, states her pain is making her mood symptoms worse.   I have reviewed all aspects of the patient's medical history including social, family, and surgical history.  Current Outpatient Medications  Medication Instructions   albuterol (VENTOLIN HFA) 108 (90 Base) MCG/ACT inhaler 2 puffs, Inhalation, Every 6 hours PRN   Budeson-Glycopyrrol-Formoterol (BREZTRI AEROSPHERE) 160-9-4.8 MCG/ACT AERO 2 puffs, Inhalation, 2 times daily   clonazePAM (KLONOPIN) 1 mg, 2 times daily    diclofenac (VOLTAREN) 75 mg, Oral, 2 times daily, Take with food.   oxymetazoline (AFRIN) 0.05 % nasal spray 1 spray, Each Nare, 2 times daily PRN   Propylene Glycol (SYSTANE BALANCE) 0.6 % SOLN 1 drop, Both Eyes, 4 times daily PRN   sertraline (ZOLOFT) 200 mg, Oral, Daily at bedtime   traZODone (DESYREL) 150 mg, Oral, Daily at bedtime    Past Medical History:  Diagnosis Date   Anxiety    Cataracts, bilateral    Depression    Fibromyalgia    Heart murmur    History of ear infections    hx of in childhood hx of perorated eardrums bilat    Hyperlipidemia    Mitral valve regurgitation    Rheumatoid arthritis (HCC)    Uterine cancer (HCC)     Past Surgical History:  Procedure Laterality Date   ABDOMINAL HYSTERECTOMY     APPENDECTOMY     CARDIAC CATHETERIZATION     2013   colonscopy      removed polyps (benign)   EYE SURGERY     lasix twice to left eye and once to right    LAMINECTOMY     LUMBAR LAMINECTOMY/DECOMPRESSION MICRODISCECTOMY Right 05/18/2014   Procedure: MICRO LUMBAR DECOMPRESSION L4 - L5 ON THE RIGHT 1 LEVEL;  Surgeon: Javier Docker, MD;  Location: [facility];  Service: Orthopedics;  Laterality: Right;   TONSILLECTOMY     T&A   TOTAL KNEE ARTHROPLASTY Left 05/27/2021  Procedure: TOTAL KNEE ARTHROPLASTY;  Surgeon: Jene Every, MD;  Location: [facility];  Service: Orthopedics;  Laterality: Left;    Family History  Problem Relation Age of Onset   Diabetes Mother    Hypertension Mother    Hyperlipidemia Mother    Arthritis Mother    Arthritis Paternal Grandfather     Social History   Socioeconomic History   Marital status: Married    Spouse name: Not on file   Number of children: 1   Years of education: Assoc   Highest education level: Not on file  Occupational History   Occupation: N/A  Tobacco Use   Smoking status: Former    Current packs/day: 0.00    Average packs/day: 2.0 packs/day for 34.0 years (68.0 ttl pk-yrs)    Types: Cigarettes    Start  date: 11/17/1979    Quit date: 11/16/2013    Years since quitting: 9.0   Smokeless tobacco: Never   Tobacco comments:    Continues to vape. Counseled to quit  Vaping Use   Vaping status: Every Day   Substances: Nicotine  Substance and Sexual Activity   Alcohol use: No   Drug use: Not Currently    Types: Marijuana   Sexual activity: Not Currently    Birth control/protection: Surgical  Other Topics Concern   Not on file  Social History Narrative   Drinks 24oz of coke a day    Social Determinants of Health   Financial Resource Strain: Not on file  Food Insecurity: Not on file  Transportation Needs: Not on file  Physical Activity: Not on file  Stress: Not on file  Social Connections: Not on file  Intimate Partner Violence: Not on file    Review of Systems  Constitutional:  Positive for malaise/fatigue. Negative for chills and fever.  Respiratory:  Positive for cough.   Cardiovascular:  Negative for chest pain, palpitations and leg swelling.  Musculoskeletal:  Positive for joint pain and myalgias.  All other systems reviewed and are negative.       Objective    BP 94/68 (BP Location: Left Arm, Patient Position: Sitting, Cuff Size: Large)   Pulse 91   Temp 98.5 F (36.9 C) (Oral)   Ht 5\' 6"  (1.676 m)   Wt 203 lb 8 oz (92.3 kg)   SpO2 96%   BMI 32.85 kg/m   Physical Exam Vitals reviewed.  Constitutional:      Appearance: Normal appearance. She is well-groomed and overweight.  Eyes:     Conjunctiva/sclera: Conjunctivae normal.  Neck:     Thyroid: No thyromegaly.  Cardiovascular:     Rate and Rhythm: Normal rate and regular rhythm.     Pulses: Normal pulses.     Heart sounds: S1 normal and S2 normal.  Pulmonary:     Effort: Pulmonary effort is normal.     Breath sounds: Normal breath sounds and air entry.  Abdominal:     General: Bowel sounds are normal.  Musculoskeletal:     Right lower leg: No edema.     Left lower leg: No edema.  Neurological:      Mental Status: She is alert and oriented to person, place, and time. Mental status is at baseline.     Gait: Gait is intact.  Psychiatric:        Mood and Affect: Mood and affect normal.        Speech: Speech normal.        Behavior: Behavior normal.  Judgment: Judgment normal.         Assessment & Plan:  Rheumatoid arthritis involving multiple sites with positive rheumatoid factor (HCC) Assessment & Plan: Had positive labs in the past, has never seen a rheumatologist before, has already had 1 knee replaced, will order all new labs and she will likely need a rheumatology referral. Will start her on daily NSAIDS to help control her pain level until the labs are back. May also consider getting x-rays of various joints to look for damage.   Orders: -     ANA w/Reflex; Future -     Rheumatoid factor; Future -     Diclofenac Sodium; Take 1 tablet (75 mg total) by mouth 2 (two) times daily. Take with food.  Dispense: 60 tablet; Refill: 5  Tobacco use disorder, moderate, in sustained remission, dependence -     CT CHEST LUNG CANCER SCREENING LOW DOSE WO CONTRAST; Future  Hyperglycemia Assessment & Plan: I reviewed her last set of labs, she did have an A1C that showed some either prediabetes or diabetes. I will check all new labs on her today. I will likely need to see her back short term to discuss her labs and A1C.   Orders: -     Hemoglobin A1c; Future  Other fatigue Assessment & Plan: Chronic, most likely from RA or chronic pain syndrome, ordering all new labs to rule out other causes. Will discuss with patient further at next visit.   Orders: -     Comprehensive metabolic panel; Future -     CBC with Differential/Platelet; Future -     TSH; Future -     B12 and Folate Panel; Future -     VITAMIN D 25 Hydroxy (Vit-D Deficiency, Fractures); Future  Chronic pain syndrome  Mixed hyperlipidemia Assessment & Plan: History of, she needs a new lipid panel to re-evaluate.    Orders: -     Lipid panel; Future  Centrilobular emphysema (HCC) Assessment & Plan: Seen on LDCT previously, she is due for another LDCT this year which I have ordered for her. Her chronic cough symptom is most likely from the emphysema. I recommended we try inhalers to help reduce her cough and she is agreeable. Calling in breztri and albuterol, see scripts below.   Orders: -     Breztri Aerosphere; Inhale 2 puffs into the lungs 2 (two) times daily.  Dispense: 10.7 g; Refill: 5 -     Albuterol Sulfate HFA; Inhale 2 puffs into the lungs every 6 (six) hours as needed for wheezing or shortness of breath.  Dispense: 8 g; Refill: 5    Return in about 1 month (around 01/02/2023).   Karie Georges, MD

## 2022-12-03 DIAGNOSIS — E782 Mixed hyperlipidemia: Secondary | ICD-10-CM | POA: Insufficient documentation

## 2022-12-03 DIAGNOSIS — M0579 Rheumatoid arthritis with rheumatoid factor of multiple sites without organ or systems involvement: Secondary | ICD-10-CM | POA: Insufficient documentation

## 2022-12-03 DIAGNOSIS — R5383 Other fatigue: Secondary | ICD-10-CM | POA: Insufficient documentation

## 2022-12-03 DIAGNOSIS — G894 Chronic pain syndrome: Secondary | ICD-10-CM | POA: Insufficient documentation

## 2022-12-03 DIAGNOSIS — F17201 Nicotine dependence, unspecified, in remission: Secondary | ICD-10-CM | POA: Insufficient documentation

## 2022-12-03 DIAGNOSIS — R739 Hyperglycemia, unspecified: Secondary | ICD-10-CM | POA: Insufficient documentation

## 2022-12-03 NOTE — Assessment & Plan Note (Signed)
I reviewed her last set of labs, she did have an A1C that showed some either prediabetes or diabetes. I will check all new labs on her today. I will likely need to see her back short term to discuss her labs and A1C.

## 2022-12-03 NOTE — Assessment & Plan Note (Signed)
Chronic, most likely from RA or chronic pain syndrome, ordering all new labs to rule out other causes. Will discuss with patient further at next visit.

## 2022-12-03 NOTE — Assessment & Plan Note (Signed)
History of, she needs a new lipid panel to re-evaluate.

## 2022-12-03 NOTE — Assessment & Plan Note (Addendum)
Had positive labs in the past, has never seen a rheumatologist before, has already had 1 knee replaced, will order all new labs and she will likely need a rheumatology referral. Will start her on daily NSAIDS to help control her pain level until the labs are back. May also consider getting x-rays of various joints to look for damage.

## 2022-12-03 NOTE — Assessment & Plan Note (Signed)
Seen on LDCT previously, she is due for another LDCT this year which I have ordered for her. Her chronic cough symptom is most likely from the emphysema. I recommended we try inhalers to help reduce her cough and she is agreeable. Calling in breztri and albuterol, see scripts below.

## 2022-12-10 ENCOUNTER — Other Ambulatory Visit (INDEPENDENT_AMBULATORY_CARE_PROVIDER_SITE_OTHER): Payer: BC Managed Care – PPO

## 2022-12-10 DIAGNOSIS — E782 Mixed hyperlipidemia: Secondary | ICD-10-CM

## 2022-12-10 DIAGNOSIS — R5383 Other fatigue: Secondary | ICD-10-CM

## 2022-12-10 DIAGNOSIS — R739 Hyperglycemia, unspecified: Secondary | ICD-10-CM | POA: Diagnosis not present

## 2022-12-17 ENCOUNTER — Encounter (INDEPENDENT_AMBULATORY_CARE_PROVIDER_SITE_OTHER): Payer: Self-pay

## 2022-12-23 ENCOUNTER — Other Ambulatory Visit: Payer: BC Managed Care – PPO

## 2022-12-23 DIAGNOSIS — M0579 Rheumatoid arthritis with rheumatoid factor of multiple sites without organ or systems involvement: Secondary | ICD-10-CM | POA: Diagnosis not present

## 2022-12-24 LAB — RHEUMATOID FACTOR: Rheumatoid fact SerPl-aCnc: 20 [IU]/mL — ABNORMAL HIGH

## 2022-12-25 ENCOUNTER — Encounter: Payer: Self-pay | Admitting: Family Medicine

## 2022-12-25 DIAGNOSIS — M0579 Rheumatoid arthritis with rheumatoid factor of multiple sites without organ or systems involvement: Secondary | ICD-10-CM

## 2023-01-06 ENCOUNTER — Ambulatory Visit: Payer: BC Managed Care – PPO | Admitting: Family Medicine

## 2023-01-06 ENCOUNTER — Encounter: Payer: Self-pay | Admitting: Family Medicine

## 2023-01-06 VITALS — BP 128/80 | HR 90 | Temp 98.3°F | Ht 66.0 in | Wt 208.0 lb

## 2023-01-06 DIAGNOSIS — G894 Chronic pain syndrome: Secondary | ICD-10-CM

## 2023-01-06 DIAGNOSIS — M25521 Pain in right elbow: Secondary | ICD-10-CM

## 2023-01-06 DIAGNOSIS — J309 Allergic rhinitis, unspecified: Secondary | ICD-10-CM | POA: Insufficient documentation

## 2023-01-06 DIAGNOSIS — M0579 Rheumatoid arthritis with rheumatoid factor of multiple sites without organ or systems involvement: Secondary | ICD-10-CM | POA: Diagnosis not present

## 2023-01-06 DIAGNOSIS — J3089 Other allergic rhinitis: Secondary | ICD-10-CM

## 2023-01-06 MED ORDER — PREGABALIN 50 MG PO CAPS: ORAL_CAPSULE | ORAL | 2 refills | Status: DC

## 2023-01-06 NOTE — Progress Notes (Signed)
Established Patient Office Visit  Subjective   Patient ID: Heather Douglas, female    DOB: [birth year]  Age: 62 y.o. MRN: [id number]  Chief Complaint  Patient presents with   Medical Management of Chronic Issues    Pt reports continued chronic pain, joint issues. Her referral to rheumatology was declined. We discussed sending to someone else and she is agreeable. She reports continued issues with her joints, that often they will "give out" on her. We had a long discussion about medications for chronic pain issues. Risks/benefits discussed with patient.     Current Outpatient Medications  Medication Instructions   albuterol (VENTOLIN HFA) 108 (90 Base) MCG/ACT inhaler 2 puffs, Inhalation, Every 6 hours PRN   Budeson-Glycopyrrol-Formoterol (BREZTRI AEROSPHERE) 160-9-4.8 MCG/ACT AERO 2 puffs, Inhalation, 2 times daily   clonazePAM (KLONOPIN) 1 mg, 2 times daily   diclofenac (VOLTAREN) 75 mg, Oral, 2 times daily, Take with food.   oxymetazoline (AFRIN) 0.05 % nasal spray 1 spray, Each Nare, 2 times daily PRN   pregabalin (LYRICA) 50 MG capsule Start with 1 capsule daily at bedtime for 1 week, then increase to 1 tablet twice a day for 1 week, then 1 tablet three times per day   Propylene Glycol (SYSTANE BALANCE) 0.6 % SOLN 1 drop, Both Eyes, 4 times daily PRN   sertraline (ZOLOFT) 200 mg, Oral, Daily at bedtime   traZODone (DESYREL) 150 mg, Oral, Daily at bedtime    Patient Active Problem List   Diagnosis Date Noted   Allergic rhinitis 01/06/2023   Tobacco use disorder, moderate, in sustained remission, dependence 12/03/2022   Hyperglycemia 12/03/2022   Other fatigue 12/03/2022   Chronic pain syndrome 12/03/2022   Mixed hyperlipidemia 12/03/2022   Rheumatoid arthritis involving multiple sites with positive rheumatoid factor (HCC) 12/03/2022   COPD (chronic obstructive pulmonary disease) (HCC) 12/02/2022   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     Objective:     BP 128/80 (BP Location: Left Arm, Patient Position: Sitting, Cuff Size: Large)   Pulse 90   Temp 98.3 F (36.8 C) (Oral)   Ht 5\' 6"  (1.676 m)   Wt 208 lb (94.3 kg)   SpO2 100%   BMI 33.57 kg/m    Physical Exam Vitals reviewed.  Constitutional:      Appearance: Normal appearance. She is obese.  Eyes:     Conjunctiva/sclera: Conjunctivae normal.  Pulmonary:     Effort: Pulmonary effort is normal.  Neurological:     Mental Status: She is alert and oriented to person, place, and time. Mental status is at baseline.  Psychiatric:        Mood and Affect: Mood normal.        Behavior: Behavior normal.      No results found for any visits on 01/06/23.  Last metabolic panel Lab Results  Component Value Date   GLUCOSE 132 (H) 12/23/2022   NA 139 12/23/2022   K 4.1 12/23/2022   CL 103 12/23/2022   CO2 25 12/23/2022   BUN 13 12/23/2022   CREATININE 0.90 12/23/2022   GFR 69.22 12/23/2022   CALCIUM 10.3 12/23/2022   PROT 8.0 12/23/2022   ALBUMIN 4.6 12/23/2022   BILITOT 0.5 12/23/2022   ALKPHOS 84 12/23/2022   AST 28 12/23/2022   ALT 34 12/23/2022   ANIONGAP 8 05/28/2021   Last lipids Lab Results  Component Value Date   CHOL 218 (H) 12/23/2022   HDL 59.70 12/23/2022   LDLCALC 131 (H) 12/23/2022   TRIG 139.0 12/23/2022   CHOLHDL 4 12/23/2022   Last hemoglobin A1c Lab Results  Component Value Date   HGBA1C 6.8 (H) 12/23/2022      The 10-year ASCVD risk score (Arnett DK, et al., 2019) is: 3.7%    Assessment & Plan:  Non-seasonal allergic rhinitis due to other allergic trigger  Rheumatoid arthritis involving multiple sites with positive rheumatoid factor (HCC) Assessment & Plan: Will send new referral to a different rheumatologist  Orders: -     Ambulatory referral to Rheumatology  Chronic pain syndrome Assessment & Plan: Risk/benefits of starting lyrica therapy  were discussed with the patient. Will start on 25 mg and gradually titrate up as needed. Script sent to pharmacy  Orders: -     Pregabalin; Start with 1 capsule daily at bedtime for 1 week, then increase to 1 tablet twice a day for 1 week, then 1 tablet three times per day  Dispense: 90 capsule; Refill: 2  Right elbow pain -     DG Elbow 2 Views Right; Future     Return in about 3 months (around 04/08/2023).    Karie Georges, MD

## 2023-01-09 ENCOUNTER — Telehealth: Payer: Self-pay | Admitting: Pharmacy Technician

## 2023-01-09 ENCOUNTER — Other Ambulatory Visit (HOSPITAL_COMMUNITY): Payer: Self-pay

## 2023-01-09 NOTE — Assessment & Plan Note (Signed)
Risk/benefits of starting lyrica therapy were discussed with the patient. Will start on 25 mg and gradually titrate up as needed. Script sent to pharmacy

## 2023-01-09 NOTE — Telephone Encounter (Signed)
Pharmacy Patient Advocate Encounter  Received notification from [employer]/ Rx Advance that Prior Authorization for Pregabalin has been APPROVED from 01/09/23 to 01/09/24. Ran test claim, Copay is $2.33. This test claim was processed through [facility]- copay amounts may vary at other pharmacies due to pharmacy/plan contracts, or as the patient moves through the different stages of their insurance plan.   PA #/Case ID/Reference #: [id number]

## 2023-01-09 NOTE — Assessment & Plan Note (Addendum)
Will send new referral to a different rheumatologist

## 2023-01-11 ENCOUNTER — Encounter: Payer: Self-pay | Admitting: Family Medicine

## 2023-01-12 NOTE — Telephone Encounter (Signed)
Spoke with Thayer Ohm at [facility] and informed him of the approval as below.

## 2023-01-14 ENCOUNTER — Other Ambulatory Visit: Payer: BC Managed Care – PPO

## 2023-01-14 ENCOUNTER — Ambulatory Visit (INDEPENDENT_AMBULATORY_CARE_PROVIDER_SITE_OTHER): Payer: BC Managed Care – PPO

## 2023-01-14 DIAGNOSIS — M25521 Pain in right elbow: Secondary | ICD-10-CM

## 2023-01-21 DIAGNOSIS — F3342 Major depressive disorder, recurrent, in full remission: Secondary | ICD-10-CM | POA: Diagnosis not present

## 2023-01-21 DIAGNOSIS — F410 Panic disorder [episodic paroxysmal anxiety] without agoraphobia: Secondary | ICD-10-CM | POA: Diagnosis not present

## 2023-02-03 ENCOUNTER — Encounter: Payer: Self-pay | Admitting: Family Medicine

## 2023-02-03 DIAGNOSIS — M0579 Rheumatoid arthritis with rheumatoid factor of multiple sites without organ or systems involvement: Secondary | ICD-10-CM

## 2023-02-19 NOTE — Telephone Encounter (Signed)
I called the number below and spoke with Mia which was [facility] at [facility].  Mia stated they no longer have a rheumatologist at their office.  Message sent to PCP

## 2023-02-19 NOTE — Telephone Encounter (Signed)
Oh I did not know that he retired. Our rheumatologists declined her referral, can we try sending her to someone else in the Area? Ok to place a new referral for someone outside of [facility]

## 2023-02-24 ENCOUNTER — Encounter: Payer: Self-pay | Admitting: Family Medicine

## 2023-03-03 ENCOUNTER — Ambulatory Visit
Admission: RE | Admit: 2023-03-03 | Discharge: 2023-03-03 | Disposition: A | Discharge status: Home / Self Care | Payer: BC Managed Care – PPO | Source: Ambulatory Visit | Attending: Family Medicine | Admitting: Family Medicine

## 2023-03-03 DIAGNOSIS — Z87891 Personal history of nicotine dependence: Secondary | ICD-10-CM | POA: Diagnosis not present

## 2023-03-03 DIAGNOSIS — F17201 Nicotine dependence, unspecified, in remission: Secondary | ICD-10-CM

## 2023-04-01 ENCOUNTER — Encounter: Payer: Self-pay | Admitting: Family Medicine

## 2023-04-07 ENCOUNTER — Ambulatory Visit: Payer: BC Managed Care – PPO | Admitting: Family Medicine

## 2023-04-08 NOTE — Telephone Encounter (Signed)
Contacted [facility] and was informed that I would be getting a call back after scheduling speak with referral coordinator to make sure referral is in their system so patient could be scheduled. I also resent the referral twice to make sure that they receive the referral.  I will be calling to check up on referral tomorrow around this time if I don't hear anything back from their scheduling team.  Thanks, Gardiner Sleeper

## 2023-04-13 ENCOUNTER — Telehealth: Payer: Self-pay

## 2023-04-13 ENCOUNTER — Other Ambulatory Visit (HOSPITAL_COMMUNITY): Payer: Self-pay

## 2023-04-13 NOTE — Telephone Encounter (Signed)
Ok to send new rx for the 8.5 gm albuterol

## 2023-04-13 NOTE — Telephone Encounter (Signed)
*  Primary  Pharmacy Patient Advocate Encounter   Received notification from [employer]My[employer]eds that prior authorization for Albuterol Sulfate HFA 108 (90 Base)MCG/ACT aerosol  is required/requested.   Insurance verification completed.   The patient is insured through [facility] .   Per test claim:  Albuterol HFA 8.5gm is preferred by the insurance.  If suggested medication is appropriate, Please send in a new RX and discontinue this one. If not, please advise as to why it's not appropriate so that we may request a Prior Authorization. Please note, some preferred medications may still require a PA

## 2023-04-14 NOTE — Telephone Encounter (Signed)
Spoke with Thayer Ohm, pharmacist at [facility] as when I attempt to enter the Rx for Albuterol 8.5gm, which is ProAir the same Rx comes up as Albuterol HFA 108.  Thayer Ohm stated the Rx will always appear the same within the list and he was given verbal approval per PCP to change the Rx to ProAir with the same instructions as below.

## 2023-05-06 ENCOUNTER — Other Ambulatory Visit: Payer: Self-pay | Admitting: Family Medicine

## 2023-05-06 DIAGNOSIS — J432 Centrilobular emphysema: Secondary | ICD-10-CM

## 2023-05-07 ENCOUNTER — Encounter: Payer: Self-pay | Admitting: Family Medicine

## 2023-05-07 DIAGNOSIS — M0579 Rheumatoid arthritis with rheumatoid factor of multiple sites without organ or systems involvement: Secondary | ICD-10-CM

## 2023-05-08 MED ORDER — METHYLPREDNISOLONE 4 MG PO TBPK: ORAL_TABLET | ORAL | 0 refills | Status: DC

## 2023-05-08 MED ORDER — MELOXICAM 15 MG PO TABS
15.0000 mg | ORAL_TABLET | Freq: Every day | ORAL | 0 refills | Status: DC
Start: 2023-05-08 — End: 2024-01-12

## 2023-05-28 ENCOUNTER — Other Ambulatory Visit (HOSPITAL_COMMUNITY): Payer: Self-pay

## 2023-05-28 ENCOUNTER — Telehealth: Payer: Self-pay

## 2023-05-28 NOTE — Telephone Encounter (Signed)
 Pharmacy Patient Advocate Encounter   Received notification from CoverMyMeds that prior authorization for Albuterol  Sulfate HFA 108 (90 Base)MCG/ACT aerosol is required/requested.   Insurance verification completed.   The patient is insured through [facility] .   Per test claim: Refill too soon. PA is not needed at this time. Medication was filled 05/09/23. Next eligible fill date is 06/03/23.

## 2023-06-04 ENCOUNTER — Other Ambulatory Visit: Payer: Self-pay | Admitting: *Deleted

## 2023-06-04 DIAGNOSIS — Z122 Encounter for screening for malignant neoplasm of respiratory organs: Secondary | ICD-10-CM

## 2023-06-04 DIAGNOSIS — Z87891 Personal history of nicotine dependence: Secondary | ICD-10-CM

## 2023-07-14 DIAGNOSIS — F4321 Adjustment disorder with depressed mood: Secondary | ICD-10-CM | POA: Diagnosis not present

## 2023-07-14 DIAGNOSIS — F3342 Major depressive disorder, recurrent, in full remission: Secondary | ICD-10-CM | POA: Diagnosis not present

## 2023-07-14 DIAGNOSIS — F410 Panic disorder [episodic paroxysmal anxiety] without agoraphobia: Secondary | ICD-10-CM | POA: Diagnosis not present

## 2023-10-06 ENCOUNTER — Ambulatory Visit (INDEPENDENT_AMBULATORY_CARE_PROVIDER_SITE_OTHER): Admitting: Family Medicine

## 2023-10-06 ENCOUNTER — Telehealth: Payer: Self-pay

## 2023-10-06 ENCOUNTER — Encounter: Payer: Self-pay | Admitting: Family Medicine

## 2023-10-06 VITALS — BP 128/62 | HR 90 | Temp 96.6°F | Ht 66.0 in | Wt 202.5 lb

## 2023-10-06 DIAGNOSIS — E119 Type 2 diabetes mellitus without complications: Secondary | ICD-10-CM | POA: Diagnosis not present

## 2023-10-06 DIAGNOSIS — Z1211 Encounter for screening for malignant neoplasm of colon: Secondary | ICD-10-CM | POA: Diagnosis not present

## 2023-10-06 DIAGNOSIS — E041 Nontoxic single thyroid nodule: Secondary | ICD-10-CM

## 2023-10-06 DIAGNOSIS — Z1231 Encounter for screening mammogram for malignant neoplasm of breast: Secondary | ICD-10-CM

## 2023-10-06 LAB — TSH: TSH: 1.75 u[IU]/mL (ref 0.35–5.50)

## 2023-10-06 LAB — POCT GLYCOSYLATED HEMOGLOBIN (HGB A1C): Hemoglobin A1C: 7 % — AB (ref 4.0–5.6)

## 2023-10-06 MED ORDER — SEMAGLUTIDE(0.25 OR 0.5MG/DOS) 2 MG/3ML ~~LOC~~ SOPN: PEN_INJECTOR | SUBCUTANEOUS | 5 refills | Status: AC

## 2023-10-06 NOTE — Telephone Encounter (Signed)
 I just documented it today

## 2023-10-06 NOTE — Progress Notes (Unsigned)
 Established Patient Office Visit  Subjective   Patient ID: Heather Douglas, female    DOB: 07/31/1960  Age: 63 y.o. MRN: [id number]  Chief Complaint  Patient presents with  . Medical Management of Chronic Issues    Patient complains of elevated glucose readings of 150-160s x1 month, patient states she has been caring for her mother and mother-in-law who passed away    Pt is here wit ha new complaint for elevated blood sugars. She reports that she was caring for mother and mother in law who passed away recently. States that she just got back into town. Has had to be on prednisone  multiple times in the past and her blood sugars have been high in the past because of this. Last A1C was 6.8. she reports her last steroids was about a month ago. A1C performed in office today and is up to 7.0. I counseled the patient on various medications that can be used since her A1C has increased to 7.0.    Current Outpatient Medications  Medication Instructions  . albuterol  (VENTOLIN  HFA) 108 (90 Base) MCG/ACT inhaler INHALE 2 PUFFS BY MOUTH EVERY 6 HOURS AS NEEDED FOR SHORTNESS OF BREATH AND FOR WHEEZING  . clonazePAM  (KLONOPIN ) 1 mg, 2 times daily  . meloxicam  (MOBIC ) 15 mg, Oral, Daily  . oxymetazoline  (AFRIN) 0.05 % nasal spray 1 spray, 2 times daily PRN  . Propylene Glycol (SYSTANE BALANCE) 0.6 % SOLN 1 drop, 4 times daily PRN  . Semaglutide,0.25 or 0.5MG /DOS, 2 MG/3ML SOPN Inject 0.25 mg into the skin once a week for 28 days, THEN 0.5 mg once a week for 28 days.  . sertraline  (ZOLOFT ) 200 mg, Daily at bedtime  . traZODone  (DESYREL ) 150 mg, Daily at bedtime    Patient Active Problem List   Diagnosis Date Noted  . Allergic rhinitis 01/06/2023  . Tobacco use disorder, moderate, in sustained remission, dependence 12/03/2022  . Hyperglycemia 12/03/2022  . Other fatigue 12/03/2022  . Chronic pain syndrome 12/03/2022  . Mixed hyperlipidemia 12/03/2022  . Rheumatoid arthritis involving multiple sites  with positive rheumatoid factor (HCC) 12/03/2022  . COPD (chronic obstructive pulmonary disease) (HCC) 12/02/2022  . 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 Spinal stenosis at L4-L5 level 05/18/2014      Review of Systems  All other systems reviewed and are negative.     Objective:     BP 128/62   Pulse 90   Temp (!) 96.6 F (35.9 C) (Axillary)   Ht 5\' 6"  (1.676 m)   Wt 202 lb 8 oz (91.9 kg)   SpO2 98%   BMI 32.68 kg/m     Physical Exam Vitals reviewed.  Constitutional:      Appearance: Normal appearance. She is well-groomed. She is obese.  Eyes:     Conjunctiva/sclera: Conjunctivae normal.  Neck:     Thyroid : No thyromegaly.  Cardiovascular:     Rate and Rhythm: Normal rate and regular rhythm.     Pulses: Normal pulses.     Heart sounds: S1 normal and S2 normal.  Pulmonary:     Effort: Pulmonary effort is normal.     Breath sounds: Normal breath sounds and air entry.  Abdominal:     General: Bowel sounds are normal.  Musculoskeletal:     Right lower leg: No edema.     Left lower leg: No edema.  Neurological:     Mental Status: She is alert and oriented to person, place, and time. Mental status is at baseline.  Gait: Gait is intact.  Psychiatric:        Mood and Affect: Mood and affect normal.        Speech: Speech normal.        Behavior: Behavior normal.        Judgment: Judgment normal.     Results for orders placed or performed in visit on 10/06/23  POC HgB A1c  Result Value Ref Range   Hemoglobin A1C 7.0 (A) 4.0 - 5.6 %   HbA1c POC (<> result, manual entry)     HbA1c, POC (prediabetic range)     HbA1c, POC (controlled diabetic range)         The 10-year ASCVD risk score (Arnett DK, et al., 2019) is: 3.7%    Assessment & Plan:  Diabetes mellitus without complication (HCC) -     POCT glycosylated hemoglobin (Hb A1C) -     Collection capillary blood specimen -     Semaglutide(0.25 or 0.5MG /DOS); Inject 0.25 mg into the skin once a week for 28 days,  THEN 0.5 mg once a week for 28 days.  Dispense: 3 mL; Refill: 5 -     Comprehensive metabolic panel with GFR; Future -     Lipid panel; Future -     Microalbumin / creatinine urine ratio; Future  Thyroid  nodule -     US  THYROID ; Future -     TSH; Future  Breast cancer screening by mammogram -     3D Screening Mammogram, Left and Right; Future  Colon cancer screening -     Cologuard     Return in about 3 months (around 01/06/2024) for DM.    Aida House, MD

## 2023-10-06 NOTE — Telephone Encounter (Signed)
 Ozempic/Mounjaro is approved exclusively as an adjunct to diet and exercise to improve glycemic control in adults with type 2 diabetes mellitus. A review of patient's medical chart reveals no documented diagnosis of type 2 diabetes or an A1C indicative of diabetes. Therefore, they do not currently meet the criteria for prior authorization of this medication. If clinically appropriate, alternative options such as Saxenda, Zepbound, or Frederik Jansky may be considered for this patient.  (Key: [id number])

## 2023-10-07 ENCOUNTER — Ambulatory Visit: Payer: Self-pay | Admitting: Family Medicine

## 2023-10-07 ENCOUNTER — Other Ambulatory Visit (HOSPITAL_COMMUNITY): Payer: Self-pay

## 2023-10-07 DIAGNOSIS — E782 Mixed hyperlipidemia: Secondary | ICD-10-CM

## 2023-10-07 LAB — LIPID PANEL
Cholesterol: 301 mg/dL — ABNORMAL HIGH (ref 0–200)
HDL: 46.2 mg/dL (ref >39.00)
LDL Cholesterol: 199 mg/dL — ABNORMAL HIGH (ref 0–99)
NonHDL: 254.6
Total CHOL/HDL Ratio: 7
Triglycerides: 279 mg/dL — ABNORMAL HIGH (ref 0.0–149.0)
VLDL: 55.8 mg/dL — ABNORMAL HIGH (ref 0.0–40.0)

## 2023-10-07 LAB — COMPREHENSIVE METABOLIC PANEL WITH GFR
ALT: 23 U/L (ref 0–35)
AST: 22 U/L (ref 0–37)
Albumin: 4.5 g/dL (ref 3.5–5.2)
Alkaline Phosphatase: 83 U/L (ref 39–117)
BUN: 17 mg/dL (ref 6–23)
CO2: 24 meq/L (ref 19–32)
Calcium: 10.1 mg/dL (ref 8.4–10.5)
Chloride: 103 meq/L (ref 96–112)
Creatinine, Ser: 0.76 mg/dL (ref 0.40–1.20)
GFR: 84.32 mL/min (ref >60.00)
Glucose, Bld: 129 mg/dL — ABNORMAL HIGH (ref 70–99)
Potassium: 4.3 meq/L (ref 3.5–5.1)
Sodium: 138 meq/L (ref 135–145)
Total Bilirubin: 0.3 mg/dL (ref 0.2–1.2)
Total Protein: 7.2 g/dL (ref 6.0–8.3)

## 2023-10-07 LAB — MICROALBUMIN / CREATININE URINE RATIO
Creatinine,U: 187.1 mg/dL
Microalb Creat Ratio: 8.6 mg/g (ref 0.0–30.0)
Microalb, Ur: 1.6 mg/dL (ref 0.0–1.9)

## 2023-10-07 NOTE — Telephone Encounter (Signed)
 Pharmacy Patient Advocate Encounter  Received notification from [facility] that Prior Authorization for Ozempic (0.25 or 0.5 MG/DOSE) 2MG /3ML pen-injectors has been APPROVED from 10/07/2023 to 10/07/2026. Ran test claim, Copay is $0. This test claim was processed through [facility]- copay amounts may vary at other pharmacies due to pharmacy/plan contracts, or as the patient moves through the different stages of their insurance plan.   PA #/Case ID/Reference #: [id number]

## 2023-10-07 NOTE — Telephone Encounter (Signed)
 Pharmacy Patient Advocate Encounter   Received notification from CoverMyMeds that prior authorization for  Ozempic (0.25 or 0.5 MG/DOSE) 2MG /3ML pen-injectors is required/requested.   Insurance verification completed.   The patient is insured through [facility] .   Per test claim: PA required; PA submitted to above mentioned insurance via CoverMyMeds Key/confirmation #/EOC [facility] Status is pending

## 2023-10-08 MED ORDER — ATORVASTATIN CALCIUM 40 MG PO TABS: 40.0000 mg | ORAL_TABLET | Freq: Every day | ORAL | 3 refills | Status: AC

## 2023-10-10 DIAGNOSIS — E041 Nontoxic single thyroid nodule: Secondary | ICD-10-CM | POA: Insufficient documentation

## 2023-10-10 DIAGNOSIS — E119 Type 2 diabetes mellitus without complications: Secondary | ICD-10-CM | POA: Insufficient documentation

## 2023-10-10 NOTE — Assessment & Plan Note (Signed)
 Patient originally thought to just have prediabetes due to chronic steroids use for her autoimmune disease,however now her A1C continues to increase. We discussed a treatment plan and the patient wants to try Ozempic. Risks/benefits discussed with patient and will send in rx. RTC in 3 months for follow up and repeat labs

## 2023-10-12 NOTE — Assessment & Plan Note (Signed)
 Reviewed results of previous US  thyroid , pt needs a follow up study

## 2023-10-13 IMAGING — DX DG KNEE 1-2V*L*
2 series · 2 of 2 positions shown · non-contrast
Comparison: None.

CLINICAL DATA: Status post left total knee arthroplasty.

EXAM:
LEFT KNEE - 1-2 VIEW

[knee ap]
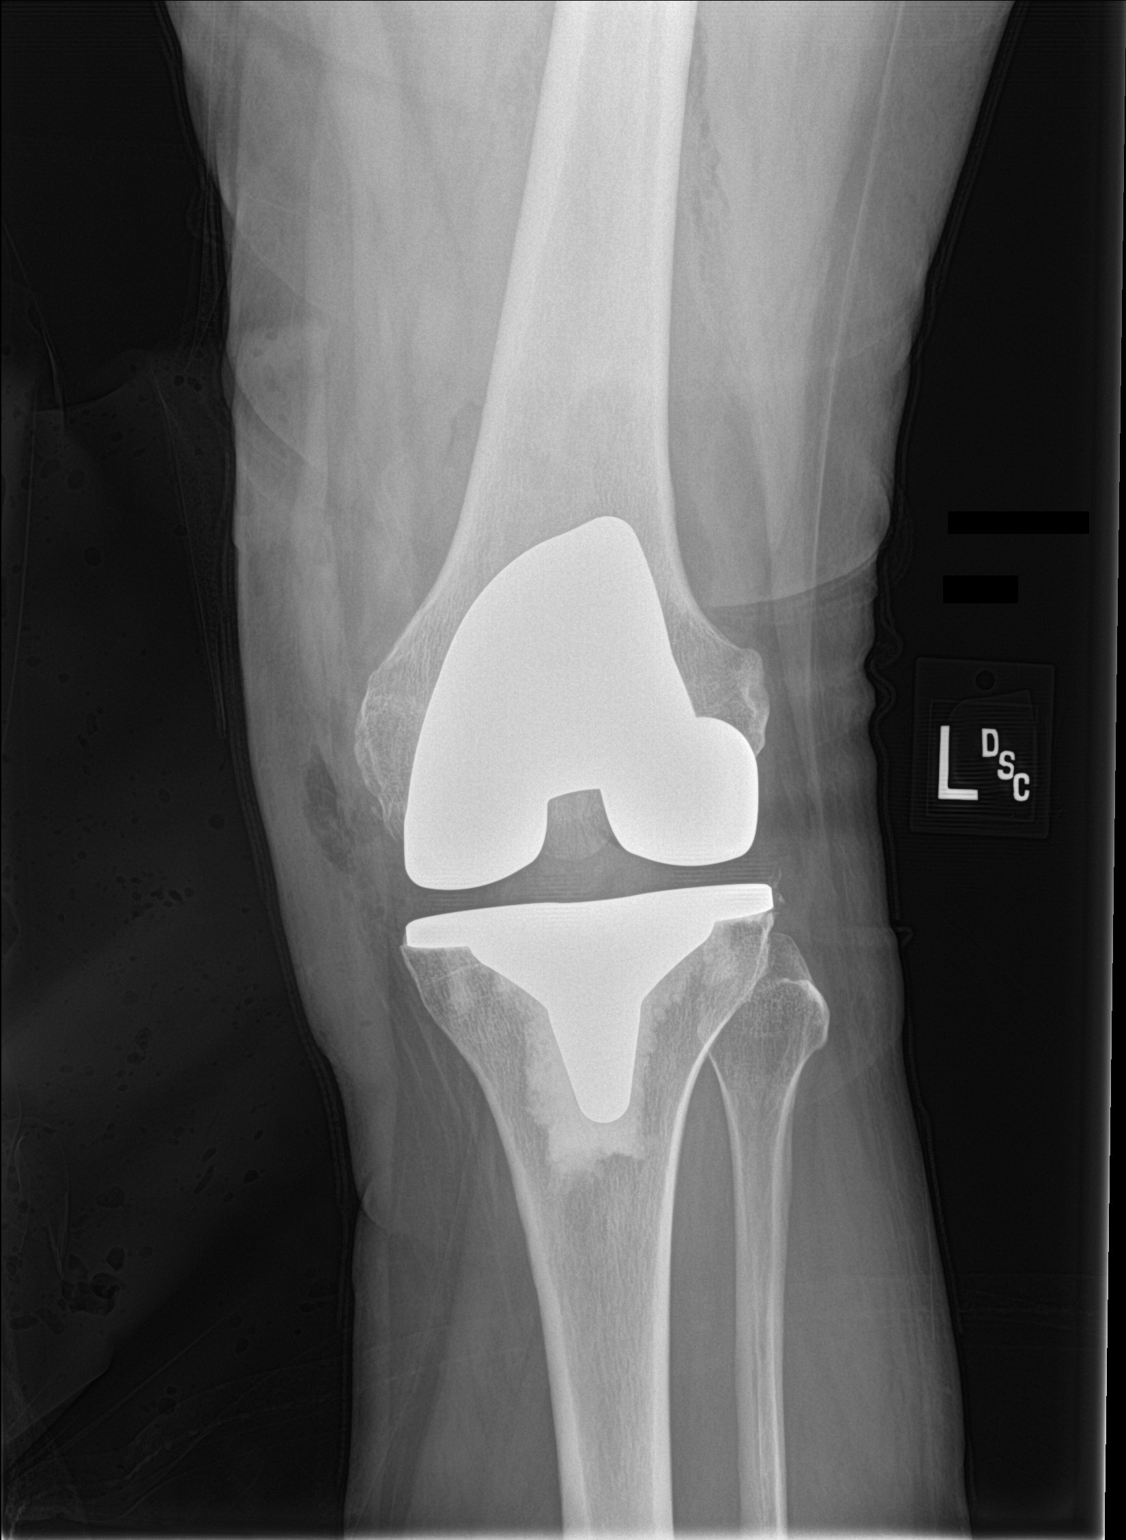

[knee lat]
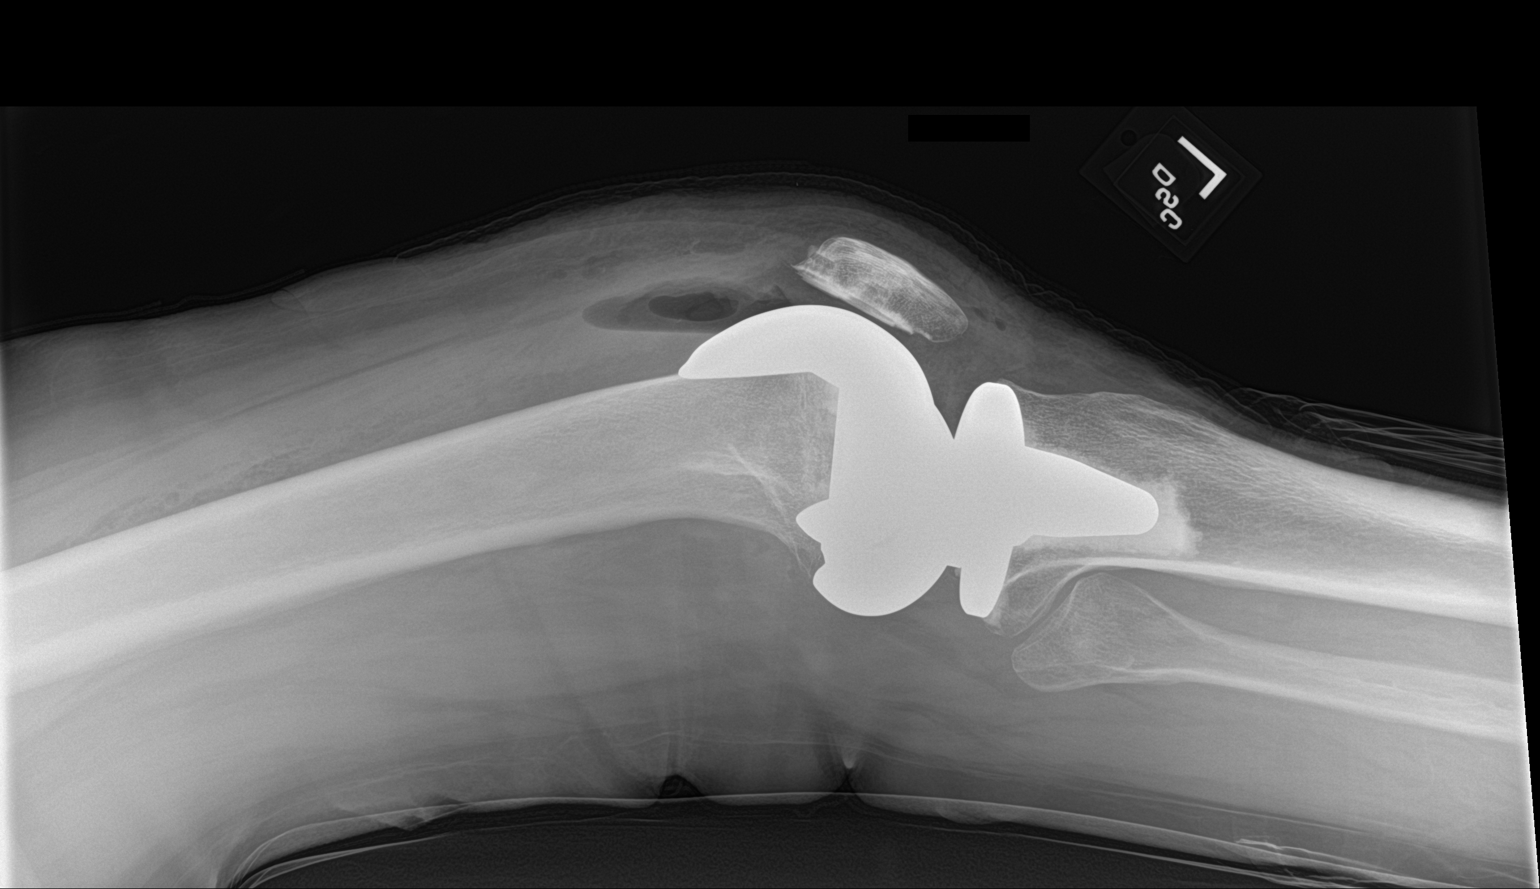

[2 of 2 positions shown; findings below may reference images not displayed]

FINDINGS: Left femoral and tibial components are well situated. Expected
postoperative changes are noted in the soft tissues anteriorly.
IMPRESSION: Status post left total knee arthroplasty.

## 2023-10-19 DIAGNOSIS — Z1211 Encounter for screening for malignant neoplasm of colon: Secondary | ICD-10-CM | POA: Diagnosis not present

## 2023-10-21 ENCOUNTER — Other Ambulatory Visit (HOSPITAL_COMMUNITY): Payer: Self-pay

## 2023-10-21 ENCOUNTER — Telehealth: Payer: Self-pay

## 2023-10-21 NOTE — Telephone Encounter (Signed)
 Pharmacy Patient Advocate Encounter   Received notification from CoverMyMeds that prior authorization for Ozempic  2 is required/requested.   Insurance verification completed.   The patient is insured through [facility] AD[employer] . Current prior authorization expires 10/06/26.   Per test claim: The current 28 day co-pay is, $0.00.  No PA needed at this time. This test claim was processed through [facility]- copay amounts may vary at other pharmacies due to pharmacy/plan contracts, or as the patient moves through the different stages of their insurance plan.

## 2023-10-23 LAB — COLOGUARD: COLOGUARD: NEGATIVE

## 2023-11-11 ENCOUNTER — Encounter: Payer: Self-pay | Admitting: Family Medicine

## 2023-12-16 DIAGNOSIS — M797 Fibromyalgia: Secondary | ICD-10-CM | POA: Diagnosis not present

## 2023-12-16 DIAGNOSIS — G894 Chronic pain syndrome: Secondary | ICD-10-CM | POA: Diagnosis not present

## 2023-12-16 DIAGNOSIS — M150 Primary generalized (osteo)arthritis: Secondary | ICD-10-CM | POA: Diagnosis not present

## 2023-12-16 DIAGNOSIS — M48061 Spinal stenosis, lumbar region without neurogenic claudication: Secondary | ICD-10-CM | POA: Diagnosis not present

## 2024-01-05 ENCOUNTER — Ambulatory Visit: Admitting: Family Medicine

## 2024-01-06 LAB — HM DIABETES EYE EXAM

## 2024-01-07 ENCOUNTER — Ambulatory Visit: Admitting: Family Medicine

## 2024-01-12 ENCOUNTER — Ambulatory Visit (INDEPENDENT_AMBULATORY_CARE_PROVIDER_SITE_OTHER): Admitting: Family Medicine

## 2024-01-12 ENCOUNTER — Encounter: Payer: Self-pay | Admitting: Family Medicine

## 2024-01-12 VITALS — BP 112/80 | HR 94 | Temp 98.1°F | Ht 66.0 in | Wt 201.0 lb

## 2024-01-12 DIAGNOSIS — E119 Type 2 diabetes mellitus without complications: Secondary | ICD-10-CM | POA: Diagnosis not present

## 2024-01-12 DIAGNOSIS — M0579 Rheumatoid arthritis with rheumatoid factor of multiple sites without organ or systems involvement: Secondary | ICD-10-CM

## 2024-01-12 DIAGNOSIS — E782 Mixed hyperlipidemia: Secondary | ICD-10-CM

## 2024-01-12 DIAGNOSIS — J310 Chronic rhinitis: Secondary | ICD-10-CM

## 2024-01-12 DIAGNOSIS — K117 Disturbances of salivary secretion: Secondary | ICD-10-CM

## 2024-01-12 DIAGNOSIS — G894 Chronic pain syndrome: Secondary | ICD-10-CM

## 2024-01-12 LAB — POCT GLYCOSYLATED HEMOGLOBIN (HGB A1C): Hemoglobin A1C: 6.7 % — AB (ref 4.0–5.6)

## 2024-01-12 MED ORDER — FLUTICASONE PROPIONATE 50 MCG/ACT NA SUSP: 2.0000 | Freq: Every day | NASAL | 6 refills | Status: AC

## 2024-01-12 MED ORDER — PILOCARPINE HCL 5 MG PO TABS: 5.0000 mg | ORAL_TABLET | Freq: Every day | ORAL | 5 refills | Status: AC

## 2024-01-12 MED ORDER — OZEMPIC (0.25 OR 0.5 MG/DOSE) 2 MG/3ML ~~LOC~~ SOPN: 0.5000 mg | PEN_INJECTOR | SUBCUTANEOUS | 5 refills | Status: DC

## 2024-01-12 NOTE — Progress Notes (Unsigned)
 Established Patient Office Visit  Subjective   Patient ID: Heather Douglas, female    DOB: [birth year]  Age: 63 y.o. MRN: [id number]  Chief Complaint  Patient presents with   Medical Management of Chronic Issues    Pt is here for follow up on her DM and the Ozempic . She reports that the medication is working well, is checking her sugars at home and sometimes she feels a little shaky like her sugar is dropping a little too much. A1C today is 6.7 which is improved, she is currently on 0.5 mg weekly and tolerating it well.  Chronic rhinitis -- pt reports chronic mucus production and drainage. States it is making her Sjogren's worse because she is breathing through her mouth at night. She went ot the rheumatologist and she reports he was no help. Did not offer her any treatments and he told her that she needs pain management. Has been trying the OTC Biotene but it only works for about an hour and her symptoms will return. We discussed other options to treat the chronic nasal drainage.  Chronic pain-- pt is requesting a referral to pain management specialist, I counseled her on her options and recommended PMR referral.     Current Outpatient Medications  Medication Instructions   albuterol  (VENTOLIN  HFA) 108 (90 Base) MCG/ACT inhaler INHALE 2 PUFFS BY MOUTH EVERY 6 HOURS AS NEEDED FOR SHORTNESS OF BREATH AND FOR WHEEZING   atorvastatin  (LIPITOR) 40 mg, Oral, Daily   clonazePAM  (KLONOPIN ) 1 mg, 2 times daily   fluticasone  (FLONASE ) 50 MCG/ACT nasal spray 2 sprays, Each Nare, Daily   oxymetazoline  (AFRIN) 0.05 % nasal spray 1 spray, 2 times daily PRN   [START ON 01/14/2024] Ozempic  (0.25 or 0.5 MG/DOSE) 0.5 mg, Subcutaneous, 2 times weekly   pilocarpine  (SALAGEN ) 5 mg, Oral, Daily   Propylene Glycol (SYSTANE BALANCE) 0.6 % SOLN 1 drop, 4 times daily PRN   sertraline  (ZOLOFT ) 200 mg, Daily at bedtime   traZODone  (DESYREL ) 150 mg, Daily at bedtime    Patient Active Problem List    Diagnosis Date Noted   Diabetes mellitus without complication (HCC) 10/10/2023   Thyroid  nodule 10/10/2023   Allergic rhinitis 01/06/2023   Tobacco use disorder, moderate, in sustained remission, dependence 12/03/2022   Hyperglycemia 12/03/2022   Other fatigue 12/03/2022   Chronic pain syndrome 12/03/2022   Mixed hyperlipidemia 12/03/2022   Rheumatoid arthritis involving multiple sites with positive rheumatoid factor (HCC) 12/03/2022   COPD (chronic obstructive pulmonary disease) (HCC) 12/02/2022   Left knee DJD 05/27/2021   Spinal stenosis at L4-L5 level 05/18/2014      Review of Systems  All other systems reviewed and are negative.     Objective:     BP 112/80   Pulse 94   Temp 98.1 F (36.7 C) (Oral)   Ht 5' 6 (1.676 m)   Wt 201 lb (91.2 kg)   SpO2 96%   BMI 32.44 kg/m    Physical Exam Vitals reviewed.  Constitutional:      Appearance: Normal appearance. She is well-groomed. She is obese.  Eyes:     Conjunctiva/sclera: Conjunctivae normal.  Neck:     Thyroid : No thyromegaly.  Cardiovascular:     Rate and Rhythm: Normal rate and regular rhythm.     Pulses: Normal pulses.     Heart sounds: S1 normal and S2 normal.  Pulmonary:     Effort: Pulmonary effort is normal.     Breath sounds: Normal breath sounds and air  entry.  Abdominal:     General: Bowel sounds are normal.  Musculoskeletal:     Right lower leg: No edema.     Left lower leg: No edema.  Neurological:     Mental Status: She is alert and oriented to person, place, and time. Mental status is at baseline.     Gait: Gait is intact.  Psychiatric:        Mood and Affect: Mood and affect normal.        Speech: Speech normal.        Behavior: Behavior normal.        Judgment: Judgment normal.      Results for orders placed or performed in visit on 01/12/24  POC HgB A1c  Result Value Ref Range   Hemoglobin A1C 6.7 (A) 4.0 - 5.6 %   HbA1c POC (<> result, manual entry)     HbA1c, POC  (prediabetic range)     HbA1c, POC (controlled diabetic range)        The 10-year ASCVD risk score (Arnett DK, et al., 2019) is: 8.7%    Assessment & Plan:  Diabetes mellitus without complication (HCC) Assessment & Plan: A1C is improving, will continue the 0.5 mg weekly dose for now since she reported shakiness/ possible low blood sugars with the medication.   Orders: -     POCT glycosylated hemoglobin (Hb A1C) -     Collection capillary blood specimen -     Ozempic  (0.25 or 0.5 MG/DOSE); Inject 0.5 mg into the skin 2 (two) times a week.  Dispense: 3 mL; Refill: 5  Mixed hyperlipidemia -     Hepatic function panel; Future -     Lipid panel; Future  Chronic pain syndrome Assessment & Plan: Has seen rheumatology who did not offer any options for her except a recommendation to see pain management. Will send patient to PMR for further evaluation and recommendations.   Orders: -     Ambulatory referral to Physical Medicine Rehab  Rheumatoid arthritis involving multiple sites with positive rheumatoid factor (HCC) -     Ambulatory referral to Physical Medicine Rehab  Chronic rhinitis Will add flonase  at night, this might help with the mouth breathing she is experiencing at night.  -     Fluticasone  Propionate; Place 2 sprays into both nostrils daily.  Dispense: 16 g; Refill: 6  Xerostomia We discussed medications for dry mouth, pt failed Biotene mouthwash. Will start patient on pilocarpine  once daily to see if this helps.   -     Pilocarpine  HCl; Take 1 tablet (5 mg total) by mouth daily.  Dispense: 30 tablet; Refill: 5     Return in about 6 months (around 07/14/2024) for DM.    Heron CHRISTELLA Sharper, MD

## 2024-01-13 ENCOUNTER — Other Ambulatory Visit: Payer: Self-pay | Admitting: Family Medicine

## 2024-01-13 DIAGNOSIS — J432 Centrilobular emphysema: Secondary | ICD-10-CM

## 2024-01-13 MED ORDER — ALBUTEROL SULFATE HFA 108 (90 BASE) MCG/ACT IN AERS: 2.0000 | INHALATION_SPRAY | Freq: Three times a day (TID) | RESPIRATORY_TRACT | 1 refills | Status: DC | PRN

## 2024-01-13 NOTE — Assessment & Plan Note (Signed)
 A1C is improving, will continue the 0.5 mg weekly dose for now since she reported shakiness/ possible low blood sugars with the medication.

## 2024-01-13 NOTE — Telephone Encounter (Signed)
 Copied from CRM #[id number]. Topic: Clinical - Medication Refill >> Jan 13, 2024  2:10 PM Franky GRADE wrote: Medication: albuterol  (VENTOLIN  HFA) 108 (90 Base) MCG/ACT inhaler [[id number]]  Has the patient contacted their pharmacy? No, she tried but it was hard for her to get someone on the line.  (Agent: If no, request that the patient contact the pharmacy for the refill. If patient does not wish to contact the pharmacy document the reason why and proceed with request.) (Agent: If yes, when and what did the pharmacy advise?)  This is the patient's preferred pharmacy:  [facility] [address] [place] - [address] [place] [address] Phone: [phone] Fax: [phone]  Is this the correct pharmacy for this prescription? Yes If no, delete pharmacy and type the correct one.   Has the prescription been filled recently? No  Is the patient out of the medication? Yes  Has the patient been seen for an appointment in the last year OR does the patient have an upcoming appointment? Yes  Can we respond through MyChart? Yes  Agent: Please be advised that Rx refills may take up to 3 business days. We ask that you follow-up with your pharmacy.

## 2024-01-13 NOTE — Assessment & Plan Note (Signed)
 Has seen rheumatology who did not offer any options for her except a recommendation to see pain management. Will send patient to PMR for further evaluation and recommendations.

## 2024-02-09 ENCOUNTER — Encounter: Payer: Self-pay | Admitting: Acute Care

## 2024-02-10 DIAGNOSIS — F410 Panic disorder [episodic paroxysmal anxiety] without agoraphobia: Secondary | ICD-10-CM | POA: Diagnosis not present

## 2024-02-10 DIAGNOSIS — F3342 Major depressive disorder, recurrent, in full remission: Secondary | ICD-10-CM | POA: Diagnosis not present

## 2024-03-03 ENCOUNTER — Encounter: Payer: Self-pay | Admitting: Acute Care

## 2024-03-08 ENCOUNTER — Other Ambulatory Visit: Payer: Self-pay | Admitting: Family Medicine

## 2024-03-08 DIAGNOSIS — J432 Centrilobular emphysema: Secondary | ICD-10-CM

## 2024-03-09 ENCOUNTER — Telehealth: Payer: Self-pay | Admitting: Family Medicine

## 2024-03-09 DIAGNOSIS — G894 Chronic pain syndrome: Secondary | ICD-10-CM

## 2024-03-09 NOTE — Telephone Encounter (Signed)
 Copied from CRM [id number]. Topic: Referral - Status >> Mar 09, 2024  9:38 AM Alfonso HERO wrote: Reason for CRM: patient husband called because he was told the referral to Physical medicine rehab is closed an needs to be reopened in order for them to set up an appt. Calling to have it reopened.

## 2024-03-09 NOTE — Telephone Encounter (Signed)
 See referral notes-PT office attempted contacting patient a number of times with no return call.

## 2024-03-10 NOTE — Telephone Encounter (Signed)
 Ok to resend the referral

## 2024-03-16 NOTE — Telephone Encounter (Signed)
 Spoke with the patient, informed her a new referral was entered as below and someone will contact her with appointment information.

## 2024-03-18 NOTE — Therapy (Incomplete)
 OUTPATIENT PHYSICAL THERAPY THORACOLUMBAR EVALUATION   Patient Name: Heather Douglas MRN: [id number] DOB:09-19-1960, 63 y.o., female Today's Date: 03/18/2024  END OF SESSION:   Past Medical History:  Diagnosis Date   Anxiety    Cataracts, bilateral    Depression    Fibromyalgia    Heart murmur    History of ear infections    hx of in childhood hx of perorated eardrums bilat    Hyperlipidemia    Mitral valve regurgitation    Rheumatoid arthritis (HCC)    Uterine cancer (HCC)    Past Surgical History:  Procedure Laterality Date   ABDOMINAL HYSTERECTOMY     APPENDECTOMY     CARDIAC CATHETERIZATION     2013   colonscopy      removed polyps (benign)   EYE SURGERY     lasix twice to left eye and once to right    LAMINECTOMY     LUMBAR LAMINECTOMY/DECOMPRESSION MICRODISCECTOMY Right 05/18/2014   Procedure: MICRO LUMBAR DECOMPRESSION L4 - L5 ON THE RIGHT 1 LEVEL;  Surgeon: Reyes JAYSON Billing, MD;  Location: [facility];  Service: Orthopedics;  Laterality: Right;   TONSILLECTOMY     T&A   TOTAL KNEE ARTHROPLASTY Left 05/27/2021   Procedure: TOTAL KNEE ARTHROPLASTY;  Surgeon: Billing Reyes, MD;  Location: [facility];  Service: Orthopedics;  Laterality: Left;   Patient Active Problem List   Diagnosis Date Noted   Diabetes mellitus without complication (HCC) 10/10/2023   Thyroid  nodule 10/10/2023   Allergic rhinitis 01/06/2023   Tobacco use disorder, moderate, in sustained remission, dependence 12/03/2022   Hyperglycemia 12/03/2022   Other fatigue 12/03/2022   Chronic pain syndrome 12/03/2022   Mixed hyperlipidemia 12/03/2022   Rheumatoid arthritis involving multiple sites with positive rheumatoid factor (HCC) 12/03/2022   COPD (chronic obstructive pulmonary disease) (HCC) 12/02/2022   Left knee DJD 05/27/2021   Spinal stenosis at L4-L5 level 05/18/2014    PCP: ***  REFERRING PROVIDER: ***  REFERRING DIAG: ***  Rationale for Evaluation and Treatment: {HABREHAB:27488}  THERAPY  DIAG:  No diagnosis found.  ONSET DATE: ***  SUBJECTIVE:                                                                                                                                                                                           SUBJECTIVE STATEMENT: ***  PERTINENT HISTORY:  Pt with chronic pain of multiple joints FMS, primary OA multiple sites, spinal stenosis, Lumbar surgery, Sjogrens, RA?, Left TKA, Emphysema  PAIN:  Are you having pain? {OPRCPAIN:27236}  PRECAUTIONS: FMS, primary OA multiple sites, spinal stenosis, Lumbar surgery, Sjogrens, RA?, Left TKA, Emphysema, DM  RED FLAGS: {PT Red Flags:29287}   WEIGHT BEARING RESTRICTIONS: No  FALLS:  Has patient fallen in last 6 months? {fallsyesno:27318}  LIVING ENVIRONMENT: Lives with: lives with their family and lives with their spouse Lives in: {Lives in:25570} Stairs: {opstairs:27293} Has following equipment at home: {Assistive devices:23999}  OCCUPATION: ***  PLOF: {PLOF:24004}  PATIENT GOALS: ***  NEXT MD VISIT: ***  OBJECTIVE:  Note: Objective measures were completed at Evaluation unless otherwise noted.  DIAGNOSTIC FINDINGS:  ***  PATIENT SURVEYS:  {rehab surveys:24030}  COGNITION: Overall cognitive status: Within functional limits for tasks assessed     SENSATION: {sensation:27233}  MUSCLE LENGTH: Hamstrings: Right *** deg; Left *** deg Debby test: Right *** deg; Left *** deg  POSTURE: rounded shoulders and forward head  PALPATION:   UPPER EXTREMITY ROM:  {AROM/PROM:27142} ROM Right eval Left eval  Shoulder flexion    Shoulder extension    Shoulder abduction    Shoulder adduction    Shoulder extension    Shoulder internal rotation    Shoulder external rotation    Elbow flexion    Elbow extension    Wrist flexion    Wrist extension    Wrist ulnar deviation    Wrist radial deviation    Wrist pronation    Wrist supination     (Blank rows = not  tested)      LUMBAR ROM:   AROM eval  Flexion   Extension   Right lateral flexion   Left lateral flexion   Right rotation   Left rotation    (Blank rows = not tested)  LOWER EXTREMITY ROM:     {AROM/PROM:27142}  Right eval Left eval  Hip flexion    Hip extension    Hip abduction    Hip adduction    Hip internal rotation    Hip external rotation    Knee flexion    Knee extension    Ankle dorsiflexion    Ankle plantarflexion    Ankle inversion    Ankle eversion     (Blank rows = not tested)  LOWER EXTREMITY MMT:    MMT Right eval Left eval  Hip flexion    Hip extension    Hip abduction    Hip adduction    Hip internal rotation    Hip external rotation    Knee flexion    Knee extension    Ankle dorsiflexion    Ankle plantarflexion    Ankle inversion    Ankle eversion     (Blank rows = not tested)  LUMBAR SPECIAL TESTS:  {lumbar special test:25242}  FUNCTIONAL TESTS:  {Functional tests:24029}  GAIT: Distance walked: *** Assistive device utilized: {Assistive devices:23999} Level of assistance: {Levels of assistance:24026} Comments: ***  TREATMENT DATE: ***  PATIENT EDUCATION:  Education details: *** Person educated: {Person educated:25204} Education method: {Education Method:25205} Education comprehension: {Education Comprehension:25206}  HOME EXERCISE PROGRAM: ***  ASSESSMENT:  CLINICAL IMPRESSION: Patient is a 63 y.o. female who was seen today for physical therapy evaluation and treatment for Chronic pain syndrome. She has a history of FMS, Sjogrens, and she has been treated in the past for RA.   OBJECTIVE IMPAIRMENTS: {opptimpairments:25111}.   ACTIVITY LIMITATIONS: {activitylimitations:27494}  PARTICIPATION LIMITATIONS: {participationrestrictions:25113}  PERSONAL FACTORS: {Personal factors:25162} are  also affecting patient's functional outcome.   REHAB POTENTIAL: {rehabpotential:25112}  CLINICAL DECISION MAKING: {clinical decision making:25114}  EVALUATION COMPLEXITY: {Evaluation complexity:25115}   GOALS: Goals reviewed with patient? {yes/no:20286}  SHORT TERM GOALS: Target date: ***  *** Baseline: Goal status: INITIAL  2.  *** Baseline:  Goal status: INITIAL  3.  *** Baseline:  Goal status: INITIAL  4.  *** Baseline:  Goal status: INITIAL  5.  *** Baseline:  Goal status: INITIAL  6.  *** Baseline:  Goal status: INITIAL  LONG TERM GOALS: Target date: ***  *** Baseline:  Goal status: INITIAL  2.  *** Baseline:  Goal status: INITIAL  3.  *** Baseline:  Goal status: INITIAL  4.  *** Baseline:  Goal status: INITIAL  5.  *** Baseline:  Goal status: INITIAL  6.  *** Baseline:  Goal status: INITIAL  PLAN:  PT FREQUENCY: {rehab frequency:25116}  PT DURATION: {rehab duration:25117}  PLANNED INTERVENTIONS: {rehab planned interventions:25118::97110-Therapeutic exercises,97530- Therapeutic [phone]- Neuromuscular re-education,97535- Self Rjmz,02859- Manual therapy,Patient/Family education}.  PLAN FOR NEXT SESSION: ***   Grayce JINNY Sheldon, PT 03/18/2024, 5:35 PM

## 2024-03-21 ENCOUNTER — Ambulatory Visit: Attending: Family Medicine

## 2024-03-21 ENCOUNTER — Telehealth: Payer: Self-pay

## 2024-03-21 NOTE — Telephone Encounter (Signed)
 Tried calling patient re: No show. Number rang for a long time and then said your call can not be completed at this time, please try again later. No way to leave message.

## 2024-04-09 ENCOUNTER — Other Ambulatory Visit: Payer: Self-pay | Admitting: Family Medicine

## 2024-04-09 DIAGNOSIS — J432 Centrilobular emphysema: Secondary | ICD-10-CM

## 2024-05-04 ENCOUNTER — Encounter: Payer: Self-pay | Admitting: Physical Medicine & Rehabilitation

## 2024-05-04 ENCOUNTER — Encounter: Admitting: Physical Medicine & Rehabilitation

## 2024-05-04 VITALS — BP 114/75 | HR 86 | Ht 66.0 in | Wt 199.2 lb

## 2024-05-04 DIAGNOSIS — M47816 Spondylosis without myelopathy or radiculopathy, lumbar region: Secondary | ICD-10-CM | POA: Diagnosis not present

## 2024-05-04 MED ORDER — METHOCARBAMOL 500 MG PO TABS: 500.0000 mg | ORAL_TABLET | Freq: Four times a day (QID) | ORAL | 2 refills | Status: DC | PRN

## 2024-05-04 MED ORDER — DICLOFENAC SODIUM 75 MG PO TBEC: 75.0000 mg | DELAYED_RELEASE_TABLET | Freq: Two times a day (BID) | ORAL | 3 refills | Status: AC

## 2024-05-04 MED ORDER — DICLOFENAC SODIUM 75 MG PO TBEC: 75.0000 mg | DELAYED_RELEASE_TABLET | Freq: Two times a day (BID) | ORAL | 3 refills | Status: DC

## 2024-05-04 MED ORDER — METHOCARBAMOL 500 MG PO TABS: 500.0000 mg | ORAL_TABLET | Freq: Four times a day (QID) | ORAL | 2 refills | Status: AC | PRN

## 2024-05-04 NOTE — Progress Notes (Signed)
 Subjective:    Patient ID: Heather Douglas, female    DOB: Jun 28, 1960, 63 y.o.   MRN: [id number]  HPI  Discussed the use of AI scribe software for clinical note transcription with the patient, who gave verbal consent to proceed.  History of Present Illness Heather Douglas is a 63 year old female with osteoarthritis and lumbar facet arthropathy, status post left knee arthroplasty and lumbar laminectomy, who presents with severe pain and mobility limitations.  Low back and hip pain - Severe, constant pain localized to the upper buttock and low back, radiating around the hip and down to the thigh. - Pain described as a sensation of being held in cement at the hips. - Present since the 1990s, initially related to occupational activities. - Associated with poor balance and difficulty crouching or bending to pick up objects. - Unable to walk in large stores or on hard flooring due to pain. - Mobility severely limited; unable to walk or perform activities of daily living, can only stand for approximately five minutes before needing to sit. - Developed limp and altered gait over time. - Lumbar laminectomy performed in 2019 for presumed nerve impingement without symptomatic improvement. - Multiple joint and back injections provided only temporary relief lasting 2-3 months; some injections may have been for ablation setup. - No recent imaging since lumbar spine MRI in 2015.  Which demonstrated degenerative disc disease at L4-L5 with mild stenosis.  There is some generalized facet arthropathy as well. - No participation in physical therapy for back or hips. - Limitations have caused frustration and embarrassment.  Bilateral knee pain and left knee arthroplasty - Knee pain began in the 1990s with sensation of swelling. - Left total knee arthroplasty performed, resulting in improved pain but persistent swelling and limited range of motion. - Continued dissatisfaction with outcome and ongoing  functional limitations. - Both knees recommended for replacement, but only left knee arthroplasty performed. - Fluid aspiration and joint injections provided only temporary benefit. - Persistent bilateral knee osteoarthritis.  Pain management strategies and response - Trialed prescription-dose ibuprofen , meloxicam  (ineffective), ice, heat, lidocaine  patches, and TENS unit (disliked). - Avoids oral pain medications due to anxiety. - No use of muscle relaxants or other oral pain medications. - Acupuncture and chiropractic assessment attempted, but manipulation not tolerated due to pain. - No recent imaging performed since 2015.  Functional status and activity limitations - Previously very active, participating in walking, basketball, and tennis. - Currently unable to exercise due to pain and mobility limitations.   Pain Inventory Average Pain 7 Pain Right Now 4 My pain is constant and pressure feeling  In the last 24 hours, has pain interfered with the following? General activity 10 Relation with others 10 Enjoyment of life 10 What TIME of day is your pain at its worst? morning , daytime, evening, and night Sleep (in general) Poor  Pain is worse with: walking, bending, sitting, standing, and some activites Pain improves with: rest, heat/ice, medication, and lying back Relief from Meds: 4-5  walk without assistance walk with assistance how many minutes can you walk? 10 ability to climb steps?  yes do you drive?  yes  retired  weakness tremor trouble walking  Any changes since last visit?  yes  Primary care Heron Sharper, MD    Family History  Problem Relation Age of Onset   Diabetes Mother    Hypertension Mother    Hyperlipidemia Mother    Arthritis Mother    Arthritis Paternal Grandfather  Social History   Socioeconomic History   Marital status: Married    Spouse name: Not on file   Number of children: 1   Years of education: Assoc   Highest  education level: Not on file  Occupational History   Occupation: N/A  Tobacco Use   Smoking status: Former    Current packs/day: 0.00    Average packs/day: 2.0 packs/day for 34.0 years (68.0 ttl pk-yrs)    Types: Cigarettes    Start date: 11/17/1979    Quit date: 11/16/2013    Years since quitting: 10.4   Smokeless tobacco: Never   Tobacco comments:    Continues to vape. Counseled to quit  Vaping Use   Vaping status: Every Day   Substances: Nicotine  Substance and Sexual Activity   Alcohol  use: No   Drug use: Not Currently    Types: Marijuana   Sexual activity: Not Currently    Birth control/protection: Surgical  Other Topics Concern   Not on file  Social History Narrative   Drinks 24oz of coke a day    Social Drivers of Health   Tobacco Use: Medium Risk (05/04/2024)   Patient History    Smoking Tobacco Use: Former    Smokeless Tobacco Use: Never    Passive Exposure: Not on Actuary Strain: Not on file  Food Insecurity: Not on file  Transportation Needs: Not on file  Physical Activity: Not on file  Stress: Not on file  Social Connections: Not on file  Depression (PHQ2-9): Medium Risk (05/04/2024)   Depression (PHQ2-9)    PHQ-2 Score: 9  Alcohol  Screen: Not on file  Housing: Not on file  Utilities: Not on file  Health Literacy: Not on file   Past Surgical History:  Procedure Laterality Date   ABDOMINAL HYSTERECTOMY     APPENDECTOMY     CARDIAC CATHETERIZATION     2013   colonscopy      removed polyps (benign)   EYE SURGERY     lasix twice to left eye and once to right    LAMINECTOMY     LUMBAR LAMINECTOMY/DECOMPRESSION MICRODISCECTOMY Right 05/18/2014   Procedure: MICRO LUMBAR DECOMPRESSION L4 - L5 ON THE RIGHT 1 LEVEL;  Surgeon: Reyes JAYSON Billing, MD;  Location: [facility];  Service: Orthopedics;  Laterality: Right;   TONSILLECTOMY     T&A   TOTAL KNEE ARTHROPLASTY Left 05/27/2021   Procedure: TOTAL KNEE ARTHROPLASTY;  Surgeon: Billing Reyes, MD;   Location: [facility];  Service: Orthopedics;  Laterality: Left;   Past Medical History:  Diagnosis Date   Anxiety    Cataracts, bilateral    Depression    Fibromyalgia    Heart murmur    History of ear infections    hx of in childhood hx of perorated eardrums bilat    Hyperlipidemia    Mitral valve regurgitation    Rheumatoid arthritis (HCC)    Uterine cancer (HCC)    BP 114/75 (BP Location: Left Arm, Patient Position: Sitting, Cuff Size: Large)   Pulse 86   Ht 5' 6 (1.676 m)   Wt 199 lb 3.2 oz (90.4 kg)   SpO2 94%   BMI 32.15 kg/m   Opioid Risk Score:   Fall Risk Score:  `1  Depression screen [facility] 2/9     05/04/2024    3:31 PM 10/06/2023    2:28 PM 01/06/2023    4:09 PM 12/02/2022    3:29 PM  Depression screen PHQ 2/9  Decreased  Interest 3 2 3 3   Down, Depressed, Hopeless 2 1 2 3   PHQ - 2 Score 5 3 5 6   Altered sleeping 0 1 2 3   Tired, decreased energy 3 2 2 3   Change in appetite 0 0 2 0  Feeling bad or failure about yourself  1 0 2 1  Trouble concentrating 0 1 1 0  Moving slowly or fidgety/restless 0 1 0 0  Suicidal thoughts 0 1 1 1   PHQ-9 Score 9 9  15  14    Difficult doing work/chores Extremely dIfficult  Somewhat difficult      Data saved with a previous flowsheet row definition      Review of Systems  Musculoskeletal:  Positive for arthralgias, back pain and myalgias.       Right hip pain, radiating down back of leg, knee pain  All other systems reviewed and are negative.      Objective:   Physical Exam Gen: no distress, normal appearing HEENT: oral mucosa pink and moist, NCAT Cardio: Reg rate Chest: normal effort, normal rate of breathing Abd: soft, non-distended Ext: no edema Psych: pleasant, normal affect Skin: intact Neuro: Alert and oriented x 3. Normal insight and awareness. Intact Memory. Normal language and speech. Cranial nerve exam unremarkable. MMT: 5/5 in all 4 except where there was some pain in the vision in her proximal lower  extremities.  Sensory exam was intact.  Reflexes were 2+.  Was appreciated.   Musculoskeletal: Patient with tenderness upon palpation of the lumbar paraspinals right more than left particularly at L4-L5 but also with tenderness to palpation over the right PSIS area and generally in the right upper pelvis region.  There appear to be no obvious pelvic asymmetry.  She stands with fairly neutral pelvis.  She bends with some discomfort but was able to achieve 45 to 50 degrees.  Extension was very limited due to pain and instability.  Facet maneuvers appear to be positive right more than left.  Rotation seem to cause some discomfort but lesser so.  Straight leg raises were equivocal on either side although there was some pain with right straight leg raise due to her generalized back and hip pain.  She ambulated for me today and really did not display any obvious antalgia with gait on the right side.  I did not appreciate any crepitus in the right knee.  I did not examine the left knee length today.  Upper body posture was fair with fairly normal shoulder girdle positioning and range of motion.  Cervical range of motion appeared generally functional       Assessment & Plan:   Assessment and Plan Assessment & Plan Chronic low back and hip pain potenitally due to lumbar facet arthropathy Severe low back and hip pain with mobility limitation due to lumbar facet arthropathy. Previous laminectomy and conservative treatments provided limited relief. No prior physical therapy for back or hips.  Posture appears reasonable - Ordered lumbar spine and pelvic radiographs to update imaging. - Prescribed oral diclofenac  twice daily. - Prescribed a muscle relaxant robaxin  500mg  q6 prn. - Referred to physical therapy for evaluation and management, focusing on posture and mobility, modalities. - Discussed potential MBB's,  ablation if conservative management fails. It appears she's had at least MBB's in the  past?  Bilateral knee osteoarthritis status post left knee arthroplasty Bilateral knee osteoarthritis with persistent left knee symptoms post-arthroplasty.  She will replace the right knee due to her experience with the left side which  has been suboptimal. Limited benefit from previous injections and aspiration.   Forty-five minutes of face to face patient care time were spent during this visit. All questions were encouraged and answered. Follow up with me in 2 mos.

## 2024-05-04 NOTE — Patient Instructions (Signed)
°  VISIT SUMMARY: Today, we discussed your ongoing severe pain and mobility limitations due to chronic low back and hip pain, as well as bilateral knee osteoarthritis. We reviewed your history, including previous treatments and surgeries, and developed a plan to help manage your symptoms and improve your quality of life.  YOUR PLAN: -CHRONIC LOW BACK AND HIP PAIN DUE TO LUMBAR FACET ARTHROPATHY: Lumbar facet arthropathy is a condition where the joints in your lower back become worn out, causing pain and stiffness. We have ordered new X-rays of your lumbar spine and pelvis to get updated images. You have been prescribed diclofenac  to take twice daily and a muscle relaxant to help manage your pain. Additionally, you are being referred to physical therapy to work on your posture and mobility. If these treatments do not provide enough relief, we may consider a procedure called lumbar facet ablation.  -BILATERAL KNEE OSTEOARTHRITIS STATUS POST LEFT KNEE ARTHROPLASTY: Osteoarthritis is a condition where the cartilage in your joints breaks down, causing pain and stiffness. You have ongoing symptoms in both knees, even after your left knee replacement. You are being referred to physical therapy to help improve the mobility and strength of your knees.  INSTRUCTIONS: Please schedule the lumbar spine and pelvic radiographs as soon as possible. Take the prescribed diclofenac  twice daily and the muscle relaxant as directed. Attend physical therapy sessions for both your back and knees. Follow up with us  after completing the imaging and starting physical therapy to discuss your progress and any further steps.

## 2024-05-13 ENCOUNTER — Other Ambulatory Visit: Payer: Self-pay | Admitting: Family Medicine

## 2024-05-13 DIAGNOSIS — E119 Type 2 diabetes mellitus without complications: Secondary | ICD-10-CM

## 2024-07-06 ENCOUNTER — Encounter: Admitting: Physical Medicine & Rehabilitation

## 2024-07-19 ENCOUNTER — Ambulatory Visit: Admitting: Family Medicine
